# Patient Record
Sex: Female | Born: 1975 | Race: White | Hispanic: Yes | Marital: Married | Smoking: Former smoker
Health system: Southern US, Community
[De-identification: ages and names within clinical notes are randomized; demographics above are authoritative.]

## PROBLEM LIST (undated history)

## (undated) DIAGNOSIS — F419 Anxiety disorder, unspecified: Secondary | ICD-10-CM

## (undated) DIAGNOSIS — F32A Depression, unspecified: Secondary | ICD-10-CM

## (undated) DIAGNOSIS — F329 Major depressive disorder, single episode, unspecified: Secondary | ICD-10-CM

## (undated) DIAGNOSIS — L309 Dermatitis, unspecified: Secondary | ICD-10-CM

## (undated) DIAGNOSIS — E063 Autoimmune thyroiditis: Secondary | ICD-10-CM

## (undated) DIAGNOSIS — J45909 Unspecified asthma, uncomplicated: Secondary | ICD-10-CM

## (undated) DIAGNOSIS — G43909 Migraine, unspecified, not intractable, without status migrainosus: Secondary | ICD-10-CM

## (undated) DIAGNOSIS — F429 Obsessive-compulsive disorder, unspecified: Secondary | ICD-10-CM

## (undated) DIAGNOSIS — D649 Anemia, unspecified: Secondary | ICD-10-CM

## (undated) HISTORY — DX: Major depressive disorder, single episode, unspecified: F32.9

## (undated) HISTORY — DX: Depression, unspecified: F32.A

## (undated) HISTORY — DX: Anxiety disorder, unspecified: F41.9

## (undated) HISTORY — DX: Unspecified asthma, uncomplicated: J45.909

## (undated) HISTORY — DX: Dermatitis, unspecified: L30.9

## (undated) HISTORY — DX: Obsessive-compulsive disorder, unspecified: F42.9

## (undated) HISTORY — DX: Anemia, unspecified: D64.9

## (undated) HISTORY — PX: RHINOPLASTY: SUR1284

## (undated) HISTORY — DX: Migraine, unspecified, not intractable, without status migrainosus: G43.909

## (undated) HISTORY — PX: SEPTOPLASTY: SUR1290

---

## 2009-09-15 ENCOUNTER — Emergency Department (HOSPITAL_COMMUNITY): Admission: EM | Admit: 2009-09-15 | Discharge: 2009-09-15 | Payer: Self-pay | Admitting: Emergency Medicine

## 2010-01-17 ENCOUNTER — Other Ambulatory Visit: Admission: RE | Admit: 2010-01-17 | Discharge: 2010-01-17 | Payer: Self-pay | Admitting: *Deleted

## 2010-06-23 LAB — COMPREHENSIVE METABOLIC PANEL
ALT: 12 U/L (ref 0–35)
Albumin: 3.5 g/dL (ref 3.5–5.2)
Alkaline Phosphatase: 75 U/L (ref 39–117)
BUN: 6 mg/dL (ref 6–23)
CO2: 24 mEq/L (ref 19–32)
Calcium: 8.7 mg/dL (ref 8.4–10.5)
Creatinine, Ser: 0.69 mg/dL (ref 0.4–1.2)
GFR calc Af Amer: >60 mL/min (ref >60)
GFR calc non Af Amer: >60 mL/min (ref >60)
Glucose, Bld: 111 mg/dL — ABNORMAL HIGH (ref 70–99)
Potassium: 3.9 mEq/L (ref 3.5–5.1)
Total Protein: 6.4 g/dL (ref 6.0–8.3)

## 2010-06-23 LAB — URINALYSIS, ROUTINE W REFLEX MICROSCOPIC
Glucose, UA: NEGATIVE mg/dL
Nitrite: NEGATIVE
Urobilinogen, UA: 0.2 mg/dL (ref 0.0–1.0)
pH: 8.5 — ABNORMAL HIGH (ref 5.0–8.0)

## 2010-06-23 LAB — DIFFERENTIAL
Basophils Relative: 1 % (ref 0–1)
Eosinophils Absolute: 0.4 10*3/uL (ref 0.0–0.7)
Eosinophils Relative: 5 % (ref 0–5)
Lymphocytes Relative: 17 % (ref 12–46)
Lymphs Abs: 1.5 10*3/uL (ref 0.7–4.0)
Monocytes Absolute: 0.5 10*3/uL (ref 0.1–1.0)
Monocytes Relative: 6 % (ref 3–12)
Neutro Abs: 6.1 10*3/uL (ref 1.7–7.7)

## 2010-06-23 LAB — CBC
Hemoglobin: 11.9 g/dL — ABNORMAL LOW (ref 12.0–15.0)
MCV: 90.8 fL (ref 78.0–100.0)
RDW: 13.3 % (ref 11.5–15.5)

## 2010-08-20 ENCOUNTER — Other Ambulatory Visit: Payer: Self-pay | Admitting: Family Medicine

## 2010-08-20 DIAGNOSIS — R1031 Right lower quadrant pain: Secondary | ICD-10-CM

## 2010-08-21 ENCOUNTER — Ambulatory Visit
Admission: RE | Admit: 2010-08-21 | Discharge: 2010-08-21 | Disposition: A | Discharge status: Home / Self Care | Payer: 59 | Source: Ambulatory Visit | Attending: Family Medicine | Admitting: Family Medicine

## 2010-08-21 DIAGNOSIS — R1031 Right lower quadrant pain: Secondary | ICD-10-CM

## 2010-08-21 MED ORDER — IOHEXOL 300 MG/ML  SOLN
100.0000 mL | Freq: Once | INTRAMUSCULAR | Status: AC | PRN
  Administered 2010-08-21: 100 mL via INTRAVENOUS

## 2011-04-20 ENCOUNTER — Other Ambulatory Visit (HOSPITAL_COMMUNITY)
Admission: RE | Admit: 2011-04-20 | Discharge: 2011-04-20 | Disposition: A | Discharge status: Home / Self Care | Payer: 59 | Source: Ambulatory Visit | Attending: Internal Medicine | Admitting: Internal Medicine

## 2011-04-20 DIAGNOSIS — Z01419 Encounter for gynecological examination (general) (routine) without abnormal findings: Secondary | ICD-10-CM | POA: Insufficient documentation

## 2011-04-20 DIAGNOSIS — Z113 Encounter for screening for infections with a predominantly sexual mode of transmission: Secondary | ICD-10-CM | POA: Insufficient documentation

## 2011-06-11 IMAGING — CT CT ABD-PELV W/ CM
2 of 4 series · 17 of 46 positions shown, 19 images · IV contrast (READICAT/WATER & [ID] OMNI 300)
Comparison: Abdominal ultrasound 09/15/2009.

CLINICAL DATA: Right lower quadrant abdominal pain for a few weeks
with nausea and constipation.

CT ABDOMEN AND PELVIS WITH CONTRAST
TECHNIQUE: Multidetector CT imaging of the abdomen and pelvis was
performed following the standard protocol during bolus
administration of intravenous contrast.
Contrast: 100 ml Amnipaque-ZVV intravenously.

[Series 2: abdomen w/ · axial · 0.70mm/px · z∈[-376,-6]mm · 14 of 82 slices shown, 16 images]
[im 4/82  soft-tissue]
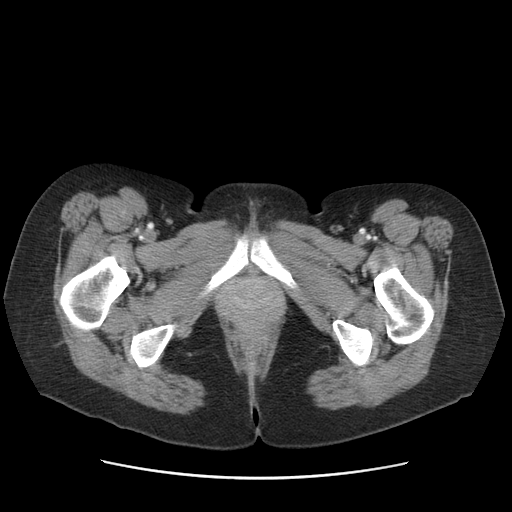
[im 4/82  bone]
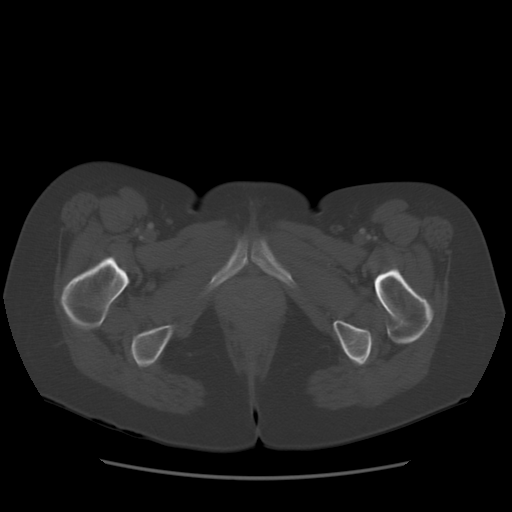
[im 10/82  soft-tissue]
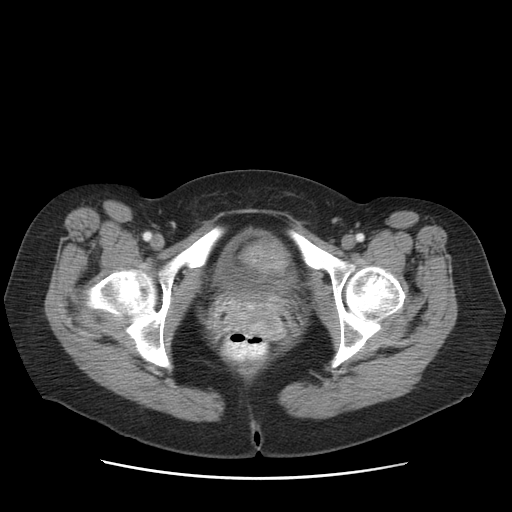
[im 17/82  soft-tissue]
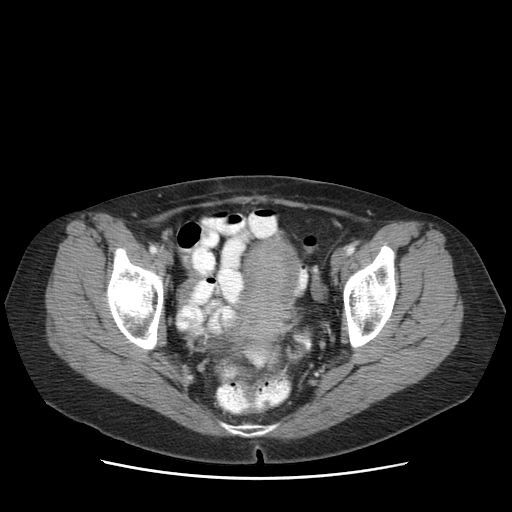
[im 23/82  soft-tissue]
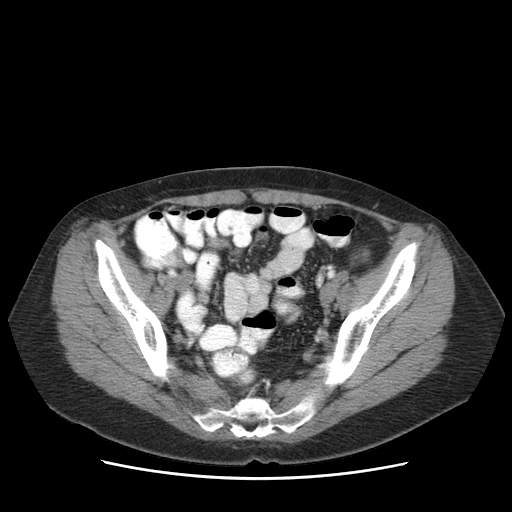
[im 26/82  soft-tissue]
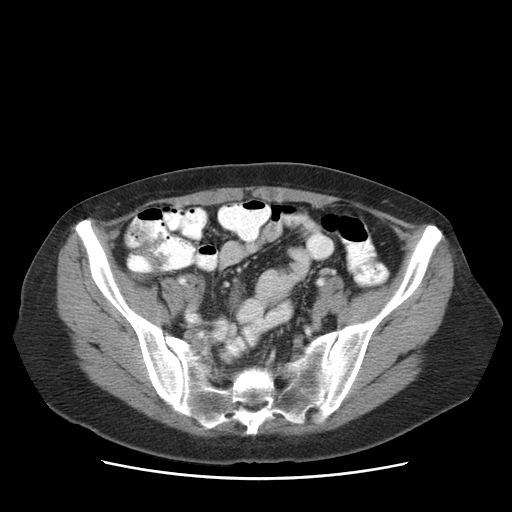
[im 33/82  soft-tissue]
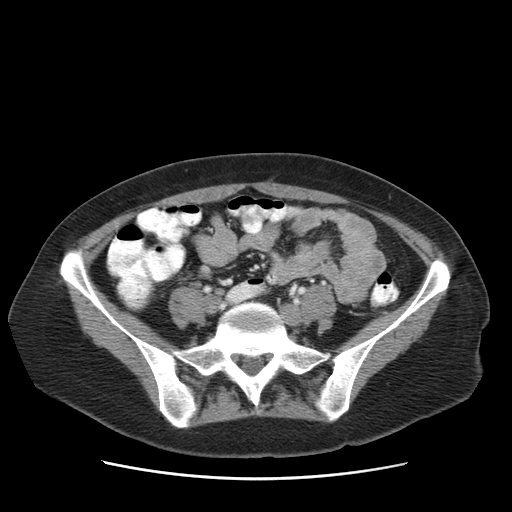
[im 39/82  soft-tissue]
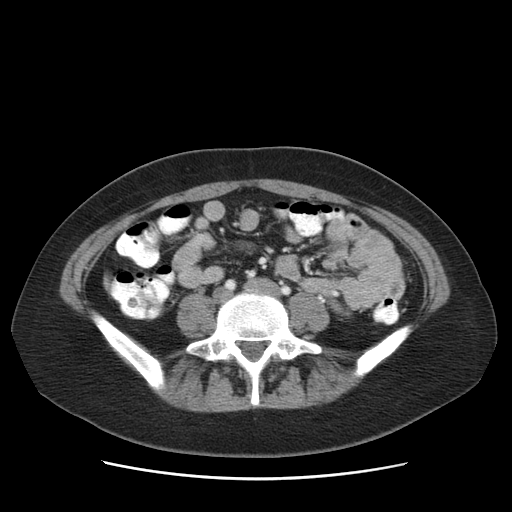
[im 43/82  soft-tissue]
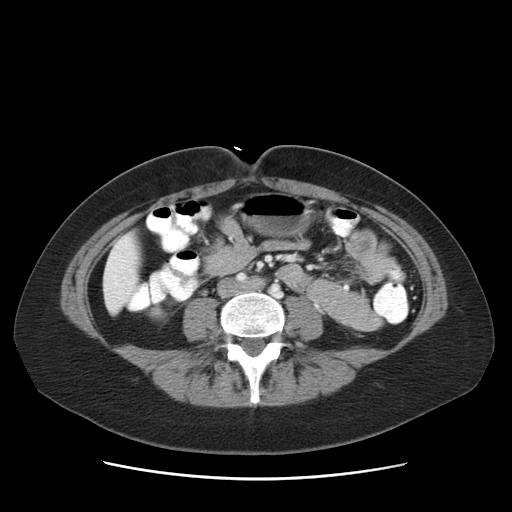
[im 49/82  soft-tissue]
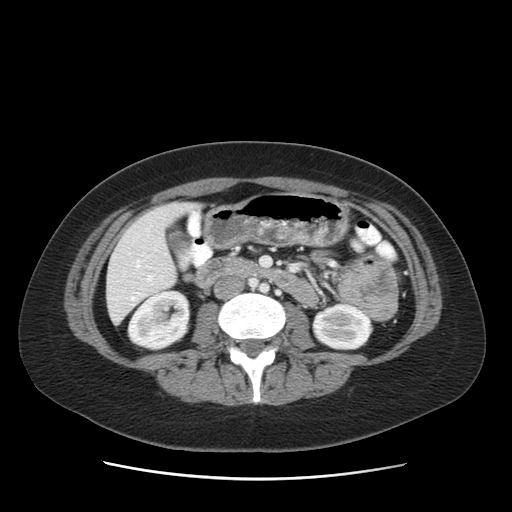
[im 49/82  bone]
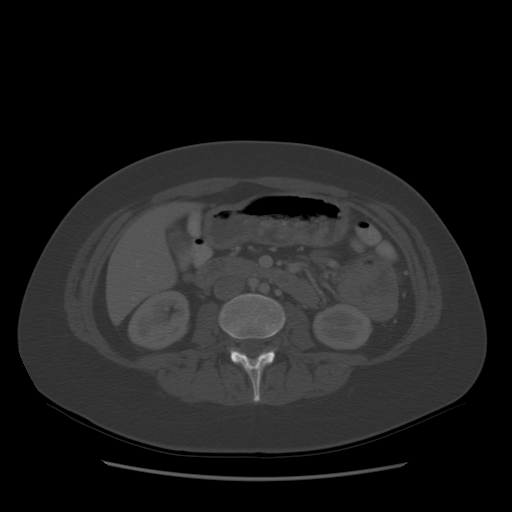
[im 56/82  soft-tissue]
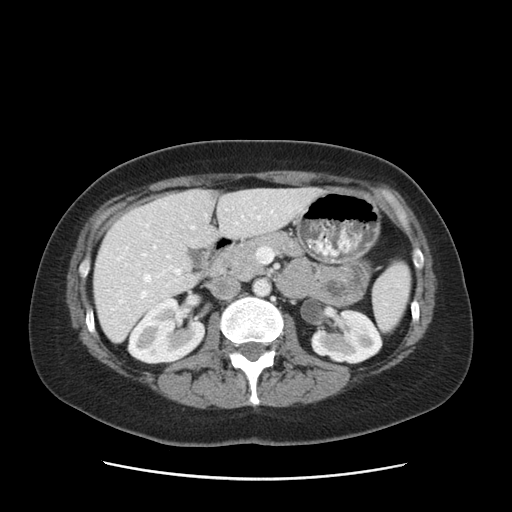
[im 62/82  soft-tissue]
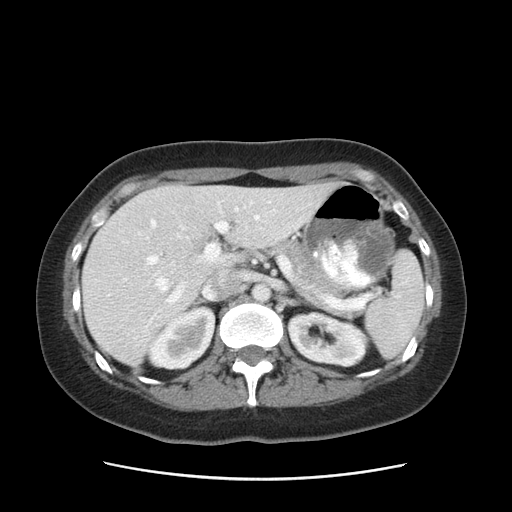
[im 65/82  soft-tissue]
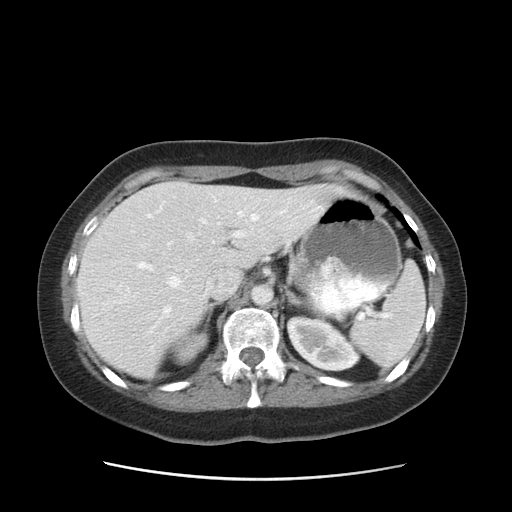
[im 72/82  soft-tissue]
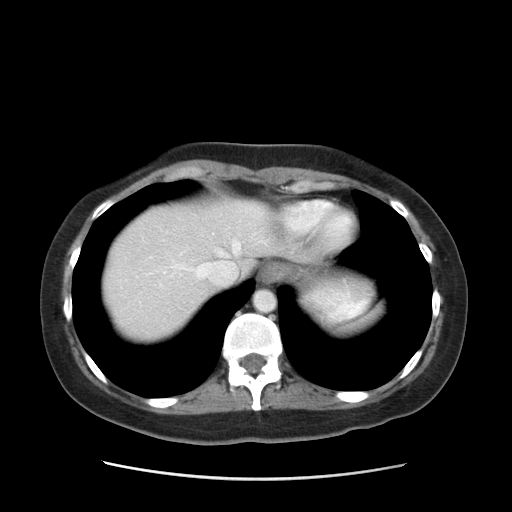
[im 78/82  soft-tissue]
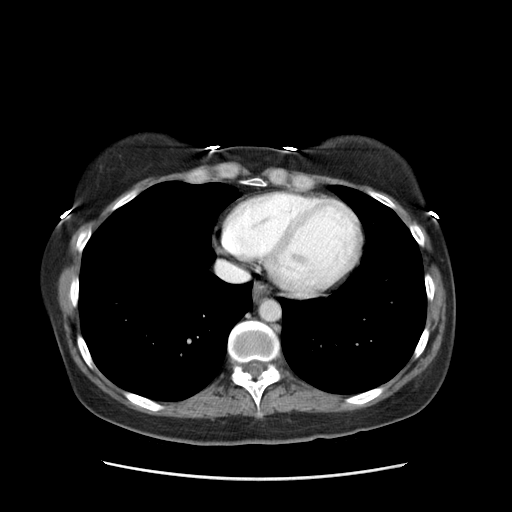

[Series 400: coronal · coronal · 0.86mm/px · 3 of 98 slices shown]
[im 33/98  soft-tissue]
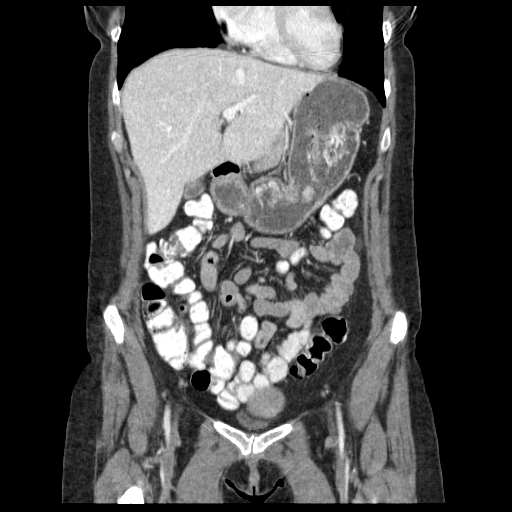
[im 44/98  soft-tissue]
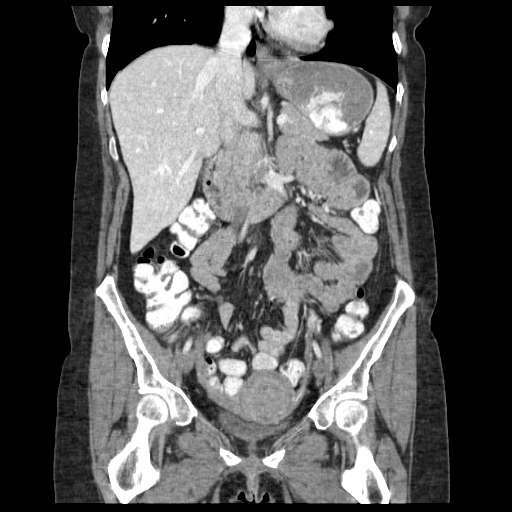
[im 54/98  soft-tissue]
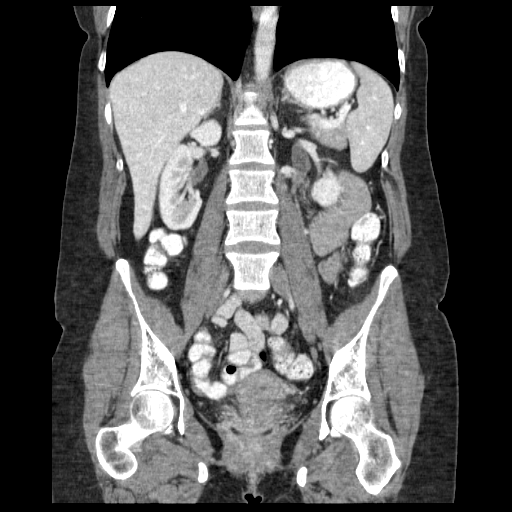

[17 of 46 positions shown; findings below may reference images not displayed]

FINDINGS: The lung bases are clear.  There is no pleural effusion.

The liver, spleen, gallbladder, pancreas, adrenal glands and
kidneys appear normal.

The bowel gas pattern is normal.  The transverse colon is
redundant.  The cecum and terminal ileum appear normal.  The
appendix is not well seen, but appears to project posteriorly along
the iliac vessels and is normal in caliber without surrounding
inflammation.  The uterus and ovaries appear normal.  There is a
contraceptive device within the vagina.  No osseous or vascular
abnormalities are identified.
IMPRESSION: Normal examination.

## 2012-11-03 ENCOUNTER — Other Ambulatory Visit (HOSPITAL_COMMUNITY)
Admission: RE | Admit: 2012-11-03 | Discharge: 2012-11-03 | Disposition: A | Discharge status: Home / Self Care | Payer: 59 | Source: Ambulatory Visit | Attending: Family Medicine | Admitting: Family Medicine

## 2012-11-03 ENCOUNTER — Other Ambulatory Visit: Payer: Self-pay | Admitting: Family Medicine

## 2012-11-03 DIAGNOSIS — Z0000 Encounter for general adult medical examination without abnormal findings: Secondary | ICD-10-CM | POA: Insufficient documentation

## 2012-12-08 ENCOUNTER — Other Ambulatory Visit: Payer: Self-pay | Admitting: Family Medicine

## 2012-12-09 ENCOUNTER — Other Ambulatory Visit: Payer: Self-pay | Admitting: Family Medicine

## 2012-12-09 DIAGNOSIS — N6320 Unspecified lump in the left breast, unspecified quadrant: Secondary | ICD-10-CM

## 2012-12-22 ENCOUNTER — Other Ambulatory Visit: Payer: Self-pay

## 2012-12-22 MED ORDER — OXYCODONE-ACETAMINOPHEN 7.5-325 MG PO TABS: 1.0000 | ORAL_TABLET | Freq: Three times a day (TID) | ORAL | Status: DC | PRN

## 2012-12-22 NOTE — Telephone Encounter (Signed)
Patient called requesting a refill on Percocet.  She would like the Rx mailed to her when it's ready.

## 2012-12-22 NOTE — Telephone Encounter (Signed)
Chart reviewed, see patient for chronic migraine, last office visit was February 2014, Percocet 30 tablets with no refill,  Please give her a followup appointment with Sheila Woods in 4-6 months

## 2012-12-23 NOTE — Telephone Encounter (Signed)
Rx signed and mailed.

## 2013-01-11 ENCOUNTER — Telehealth: Payer: Self-pay | Admitting: Neurology

## 2013-01-11 MED ORDER — TOPIRAMATE 50 MG PO TABS: ORAL_TABLET | ORAL | Status: DC

## 2013-01-11 NOTE — Telephone Encounter (Signed)
Rx sent 

## 2013-04-19 ENCOUNTER — Encounter: Payer: Self-pay | Admitting: Nurse Practitioner

## 2013-04-24 ENCOUNTER — Encounter: Payer: Self-pay | Admitting: Nurse Practitioner

## 2013-04-24 ENCOUNTER — Ambulatory Visit (INDEPENDENT_AMBULATORY_CARE_PROVIDER_SITE_OTHER): Payer: 59 | Admitting: Nurse Practitioner

## 2013-04-24 ENCOUNTER — Encounter (INDEPENDENT_AMBULATORY_CARE_PROVIDER_SITE_OTHER): Payer: Self-pay

## 2013-04-24 VITALS — BP 100/64 | HR 74 | Ht 66.0 in | Wt 130.0 lb

## 2013-04-24 DIAGNOSIS — F341 Dysthymic disorder: Secondary | ICD-10-CM | POA: Insufficient documentation

## 2013-04-24 DIAGNOSIS — G43909 Migraine, unspecified, not intractable, without status migrainosus: Secondary | ICD-10-CM

## 2013-04-24 MED ORDER — OXYCODONE-ACETAMINOPHEN 7.5-325 MG PO TABS: 1.0000 | ORAL_TABLET | Freq: Three times a day (TID) | ORAL | Status: DC | PRN

## 2013-04-24 MED ORDER — TOPIRAMATE 50 MG PO TABS: ORAL_TABLET | ORAL | Status: DC

## 2013-04-24 MED ORDER — SUMATRIPTAN SUCCINATE 6 MG/0.5ML ~~LOC~~ SOTJ: 6.0000 mg | SUBCUTANEOUS | Status: DC | PRN

## 2013-04-24 NOTE — Patient Instructions (Signed)
Given co pay card for Sumavel Refill percocet Continue Topamax at current dose will refill F/U yearly and prn

## 2013-04-24 NOTE — Progress Notes (Signed)
[facility]  PATIENT: Sheila Woods DOB: 07-14-1975   REASON FOR VISIT: Followup for migraine   HISTORY OF PRESENT ILLNESS:Ms Sheila Woods, 38 year old female returns for followup. She has a history of migraines and is currently on Topamax 150 daily. She has Sumavel  ordered acutely, but due to cost has been taking Percocet. She was recently diagnosed as hypothyroid. She continues to have adjustments in her medication. Headaches are in fairly good control. She returns for reevaluation    She has PMHx of OCD, iron deficiency anemia, depression presenting with chronic migraines.She had headaches since age 89, migraine described as severe stabbing pain, lateralized, radiating to her jaw, often proceed by visual changes, loss of peripheral vision.  sometimes, oculomigraine alone without significant headahces. Headache lasted 2 hours, with preventive Topamax, she only has 1-2 every month,  She is taking percocet 7.5/325 prn and sumavel prn. Tired imitrex injection, and other triptan in the past including imitrex po, maxalt, relpax, zomig, triximex,without help.  She has tried other preventive medications in the past including pamelor, effexor, elavil, nortriptyline, depakote without help either.   07/16/11: Pt reports migraines are better, she has been taking an over the counter preparation caled Limu. She claims she is taking her Topamax as well even though [name] received a letter from [employer] stating her compliance with meds was less than 70%.She did not do Botox because of her co pay. She has seen ENT Dr. [name] who wanted her to have CT to see if polyps had returned but she did not want to go through another surgery. She has been placed on abilify by psych. She recently got married. No new neurologic complaints.   04/22/12: Returns for followup. Has been off her Topamax for several months because she ran out. Has had a daily headache since that time. She has stopped her  Propranolol. She continues to see psych for her anxiety, depression. She works out of the home.     REVIEW OF SYSTEMS: Full 14 system review of systems performed and notable only for those listed, all others are neg:  Constitutional: Fatigue Cardiovascular: Palpitations  Ear/Nose/Throat: N/A  Skin: N/A  Eyes: N/A  Respiratory: N/A  Gastroitestinal: N/A  Hematology/Lymphatic: N/A  Endocrine: Intolerance to cold  Musculoskeletal: Joint pain aching muscles Allergy/Immunology: N/A  Neurological: Headache Psychiatric: Depression anxiety   ALLERGIES: No Known Allergies  HOME MEDICATIONS: Outpatient Prescriptions Prior to Visit  Medication Sig Dispense Refill  . cetirizine (ZYRTEC) 10 MG chewable tablet Chew 10 mg by mouth daily.      . clonazePAM (KLONOPIN) 2 MG tablet Take 2 mg by mouth 2 (two) times daily.      Marland Kitchen. etonogestrel-ethinyl estradiol (NUVARING) 0.12-0.015 MG/24HR vaginal ring Place 1 each vaginally every 28 (twenty-eight) days. Insert vaginally and leave in place for 3 consecutive weeks, then remove for 1 week.      Marland Kitchen. FLUoxetine (PROZAC) 40 MG capsule Take 40 mg by mouth 2 (two) times daily.       Marland Kitchen. oxyCODONE-acetaminophen (PERCOCET) 7.5-325 MG per tablet Take 1 tablet by mouth every 8 (eight) hours as needed.  30 tablet  0  . SUMAtriptan Succinate (SUMAVEL DOSEPRO) 6 MG/0.5ML SOTJ Inject into the skin.      Marland Kitchen. topiramate (TOPAMAX) 50 MG tablet One in the morning and two at night  90 tablet  3  . BUPROPION HCL PO Take by mouth.       No facility-administered medications prior to visit.  PAST MEDICAL HISTORY: Past Medical History  Diagnosis Date  . Anemia   . Asthma   . OCD (obsessive compulsive disorder)   . Eczema   . Migraine   . Anxiety   . Depression     PAST SURGICAL HISTORY: Past Surgical History  Procedure Laterality Date  . Rhinoplasty    . Septoplasty    . Cesarean section      FAMILY HISTORY: History reviewed. No pertinent family  history.  SOCIAL HISTORY: History   Social History  . Marital Status: Married    Spouse Name: Sheila Woods    Number of Children: 1  . Years of Education: college   Occupational History  .  [employer]   Social History Main Topics  . Smoking status: Former Games developer  . Smokeless tobacco: Never Used  . Alcohol Use: Yes     Comment: occas.  . Drug Use: No     Comment: quit over 20 years ago  . Sexual Activity: Not on file   Other Topics Concern  . Not on file   Social History Narrative   Patient is married Sheila Woods) and has one child.   Patient works from home for [employer].   Patient has a college education.   Patient drinks one cup of caffeine daily.           PHYSICAL EXAM  Filed Vitals:   04/24/13 1453  BP: 100/64  Pulse: 74  Height: 5\' 6"  (1.676 m)  Weight: 130 lb (58.968 kg)   Body mass index is 20.99 kg/(m^2).  Generalized: Well developed, in no acute distress   Neurological examination   Mentation: Alert oriented to time, place, history taking. Follows all commands speech and language fluent  Cranial nerve II-XII: Pupils were equal round reactive to light extraocular movements were full, visual field were full on confrontational test. Facial sensation and strength were normal. hearing was intact to finger rubbing bilaterally. Uvula tongue midline. head turning and shoulder shrug were normal and symmetric.Tongue protrusion into cheek strength was normal. Motor: normal bulk and tone, full strength in the BUE, BLE, fine finger movements normal, no pronator drift. No focal weakness Coordination: finger-nose-finger, heel-to-shin bilaterally, no dysmetria Reflexes: Brachioradialis 2/2, biceps 2/2, triceps 2/2, patellar 2/2, Achilles 2/2, plantar responses were flexor bilaterally. Gait and Station: Rising up from seated position without assistance, normal stance,  moderate stride, good arm swing, smooth turning, able to perform tiptoe, and heel walking without  difficulty. Tandem gait is steady  DIAGNOSTIC DATA (LABS, IMAGING, TESTING) -None to review     ASSESSMENT AND PLAN  38 y.o. year old female  has a past medical history of Anemia; Asthma; OCD (obsessive compulsive disorder);  Migraine; Anxiety; and Depression. here to followup.  Given co pay card for Sumavel this is to be used acutely before Percocet Refill percocet Continue Topamax at current dose will refill F/U yearly and prn  Nilda Riggs, [facility], [facility], APRN  [facility] [address], [address] [place], [place] [address] [phone]

## 2013-07-10 ENCOUNTER — Other Ambulatory Visit: Payer: 59

## 2013-07-13 ENCOUNTER — Other Ambulatory Visit: Payer: 59

## 2013-08-31 ENCOUNTER — Other Ambulatory Visit: Payer: 59

## 2013-09-08 ENCOUNTER — Other Ambulatory Visit: Payer: 59

## 2013-11-28 ENCOUNTER — Other Ambulatory Visit: Payer: Self-pay | Admitting: Neurology

## 2013-11-28 NOTE — Telephone Encounter (Signed)
Spoke to patient and she relayed that she needs a new prescription for Percocet. She mentioned that she normally has it mailed.  This is a Dr. Mare Ferrari patient.

## 2013-11-28 NOTE — Telephone Encounter (Signed)
Dr. Terrace Arabia is back tomorrow; will forward to her to see if she wants patient on long term narcotics for migraine. -VRP

## 2013-11-28 NOTE — Telephone Encounter (Signed)
Dr Terrace Arabia is out of the office.  Rx entered, and request forwarded to [facility] for approval.

## 2013-11-29 MED ORDER — OXYCODONE-ACETAMINOPHEN 7.5-325 MG PO TABS: 1.0000 | ORAL_TABLET | Freq: Three times a day (TID) | ORAL | Status: DC | PRN

## 2013-11-29 NOTE — Telephone Encounter (Signed)
Chart reviewed, I refilled her percocet prn

## 2013-12-05 NOTE — Telephone Encounter (Signed)
Called pt to inform her that her Rx was being mailed out today and if she has any other problems, questions or concerns to call the office. Pt verbalized understanding. °

## 2013-12-28 ENCOUNTER — Other Ambulatory Visit (HOSPITAL_COMMUNITY)
Admission: RE | Admit: 2013-12-28 | Discharge: 2013-12-28 | Disposition: A | Discharge status: Home / Self Care | Payer: 59 | Source: Ambulatory Visit | Attending: Family Medicine | Admitting: Family Medicine

## 2013-12-28 ENCOUNTER — Other Ambulatory Visit: Payer: Self-pay | Admitting: Family Medicine

## 2013-12-28 DIAGNOSIS — Z0000 Encounter for general adult medical examination without abnormal findings: Secondary | ICD-10-CM | POA: Insufficient documentation

## 2014-01-01 LAB — CYTOLOGY - PAP

## 2014-02-27 ENCOUNTER — Other Ambulatory Visit: Payer: Self-pay | Admitting: Nurse Practitioner

## 2014-02-27 MED ORDER — OXYCODONE-ACETAMINOPHEN 7.5-325 MG PO TABS: 1.0000 | ORAL_TABLET | Freq: Three times a day (TID) | ORAL | Status: DC | PRN

## 2014-02-27 NOTE — Telephone Encounter (Signed)
Request entered, forwarded to provider for approval.  

## 2014-02-27 NOTE — Telephone Encounter (Signed)
Patient requesting Rx refill for oxyCODONE-acetaminophen (PERCOCET) 7.5-325 MG per tablet.  Please call when ready for pick up. °

## 2014-02-28 NOTE — Telephone Encounter (Signed)
I called the patient to let them know their Rx for Oxycodone (Percocet) was ready for pickup. Patient was instructed to bring Photo ID. 

## 2014-04-23 ENCOUNTER — Other Ambulatory Visit: Payer: Self-pay

## 2014-04-23 MED ORDER — OXYCODONE-ACETAMINOPHEN 7.5-325 MG PO TABS: 1.0000 | ORAL_TABLET | Freq: Three times a day (TID) | ORAL | Status: DC | PRN

## 2014-04-23 NOTE — Telephone Encounter (Signed)
Patient requesting refill on Percocet 7.5-500mg  1 tab q8h prn

## 2014-04-23 NOTE — Telephone Encounter (Signed)
Request entered, forwarded to provider for approval.   Patient has appt scheduled in March

## 2014-04-24 ENCOUNTER — Telehealth: Payer: Self-pay

## 2014-04-24 ENCOUNTER — Ambulatory Visit: Payer: 59 | Admitting: Nurse Practitioner

## 2014-04-24 NOTE — Telephone Encounter (Signed)
Called patient and informed Rx ready for pick up at front desk. Patient verbalized understanding.  

## 2014-05-09 ENCOUNTER — Other Ambulatory Visit: Payer: Self-pay | Admitting: Family Medicine

## 2014-05-09 DIAGNOSIS — R1031 Right lower quadrant pain: Secondary | ICD-10-CM

## 2014-05-14 ENCOUNTER — Other Ambulatory Visit: Payer: Self-pay | Admitting: Nurse Practitioner

## 2014-05-22 ENCOUNTER — Telehealth: Payer: Self-pay

## 2014-05-22 NOTE — Telephone Encounter (Signed)
See previous phone call about schedule change.

## 2014-05-22 NOTE — Telephone Encounter (Signed)
Called and left patient a message about rescheduling her apt  With Bentonarolyn on 06-26-14. At 3:00. CM/Yan. Please when patient call's reschedule her apt. Thanks Annabelle Harmanana.

## 2014-06-06 ENCOUNTER — Telehealth: Payer: Self-pay | Admitting: Nurse Practitioner

## 2014-06-06 NOTE — Telephone Encounter (Signed)
Pt is calling requesting a written Rx for oxyCODONE-acetaminophen (PERCOCET) 7.5-325 MG per tablet. Please call when ready for pick up.

## 2014-06-07 NOTE — Telephone Encounter (Signed)
Michelle: Please call patients, last visit was with Darrol Angelarolyn Martin January 2015, follow-up visit is scheduled for April 2016, we will discuss her medication treatment then  I just gave her a new prescription of Percocet 30 tablets in January 2016, will not give her another one.

## 2014-06-07 NOTE — Telephone Encounter (Signed)
Spoke to patient - moved appt to an earlier date.

## 2014-06-26 ENCOUNTER — Ambulatory Visit (INDEPENDENT_AMBULATORY_CARE_PROVIDER_SITE_OTHER): Payer: 59 | Admitting: Neurology

## 2014-06-26 ENCOUNTER — Encounter: Payer: Self-pay | Admitting: Neurology

## 2014-06-26 ENCOUNTER — Ambulatory Visit: Payer: Self-pay | Admitting: Nurse Practitioner

## 2014-06-26 VITALS — BP 119/83 | HR 91 | Ht 66.0 in | Wt 140.0 lb

## 2014-06-26 DIAGNOSIS — G43009 Migraine without aura, not intractable, without status migrainosus: Secondary | ICD-10-CM | POA: Diagnosis not present

## 2014-06-26 MED ORDER — NORTRIPTYLINE HCL 10 MG PO CAPS: 20.0000 mg | ORAL_CAPSULE | Freq: Every day | ORAL | Status: DC

## 2014-06-26 MED ORDER — OXYCODONE-ACETAMINOPHEN 7.5-325 MG PO TABS: 1.0000 | ORAL_TABLET | Freq: Three times a day (TID) | ORAL | Status: DC | PRN

## 2014-06-26 MED ORDER — TOPIRAMATE 100 MG PO TABS: 100.0000 mg | ORAL_TABLET | Freq: Two times a day (BID) | ORAL | Status: DC

## 2014-06-26 MED ORDER — DICLOFENAC POTASSIUM(MIGRAINE) 50 MG PO PACK: 50.0000 mg | PACK | ORAL | Status: DC | PRN

## 2014-06-26 NOTE — Progress Notes (Signed)
[facility]  PATIENT: Sheila Woods DOB: 11/08/1975   REASON FOR VISIT: Followup for migraine   HISTORY OF PRESENT ILLNESS:Sheila Woods, 39 year-old female returns for followup.   She has PMHx of OCD, iron deficiency anemia, depression presenting with chronic migraines.She had headaches since age 35, migraine described as severe stabbing pain, lateralized, radiating to her jaw, often proceed by visual changes, loss of peripheral vision.  sometimes, oculomigraine alone without significant headahces. Headache lasted 2 hours, with preventive Topamax, she only has 1-2 every month,  She is taking percocet 7.5/325 prn and sumavel prn. Tired imitrex injection, and other triptan in the past including imitrex po, maxalt, relpax, zomig, triximex,without help.  She has tried other preventive medications in the past including pamelor, effexor, elavil, nortriptyline, depakote without help either.  07/16/11: Pt reports migraines are better, she has been taking an over the counter preparation caled Limu. She claims she is taking her Topamax as well even though Dr. Terrace Arabia received a letter from united [employer] stating her compliance with meds was less than 70%.She did not do Botox because of her co pay. She has seen ENT Dr. Jearld Fenton who wanted her to have CT to see if polyps had returned but she did not want to go through another surgery. She has been placed on abilify by psych. She recently got married. No new neurologic complaints.   04/22/12: Returns for followup. Has been off her Topamax for several months because she ran out. Has had a daily headache since that time. She has stopped her Propranolol. She continues to see psych for her anxiety, depression. She works out of the home.     04/2013: She has a history of migraines and is currently on Topamax 150 daily. She has Sumavel  ordered acutely, but due to cost has been taking Percocet. She was recently diagnosed as hypothyroid. She continues to  have adjustments in her medication. Headaches are in fairly good control. She returns for reevaluation  UPDATE March 22nd 2016: She still has headache 3 times a week, retrorbital area, occipital region, pounding, light noise sensitive, nauseous,  She has to take off from work, up to 2 days a week, she is on Topamax 150 mg daily, denies significant side effect, she used about 30 tablets of Percocet in one month, previously tried different triptan's, including Imitrex, Maxalt, Zomig, Relpax, without helping, currently taking Imitrex injection as needed, not sure if this truly helping her, Percocet helps her temporarily, she is continue combating with significant depression, is taking Latuda and Clonazepam   REVIEW OF SYSTEMS: Full 14 system review of systems performed and notable only for those listed, all others are neg: Blurred vision, cold intolerance, memory loss, dizziness, headaches, speech difficulty, depression, anxiety, insomnia, daytime sleepiness, snoring    ALLERGIES: No Known Allergies  HOME MEDICATIONS: Outpatient Prescriptions Prior to Visit  Medication Sig Dispense Refill  . cetirizine (ZYRTEC) 10 MG chewable tablet Chew 10 mg by mouth daily.    . clonazePAM (KLONOPIN) 2 MG tablet Take 2 mg by mouth 2 (two) times daily.    Marland Kitchen levothyroxine (SYNTHROID, LEVOTHROID) 50 MCG tablet Take 50 mcg by mouth daily.    Marland Kitchen oxyCODONE-acetaminophen (PERCOCET) 7.5-325 MG per tablet Take 1 tablet by mouth every 8 (eight) hours as needed. 30 tablet 0  . SUMAtriptan Succinate (SUMAVEL DOSEPRO) 6 MG/0.5ML SOTJ Inject 0.5 mLs (6 mg total) into the skin as needed. 4 Syringe 6  . topiramate (TOPAMAX) 50 MG tablet TAKE 1 TABLET  BY MOUTH INTHE MORNING AND 2 TABLETSAT NIGHT 90 tablet 1  . etonogestrel-ethinyl estradiol (NUVARING) 0.12-0.015 MG/24HR vaginal ring Place 1 each vaginally every 28 (twenty-eight) days. Insert vaginally and leave in place for 3 consecutive weeks, then remove for 1 week.    Marland Kitchen  FLUoxetine (PROZAC) 40 MG capsule Take 40 mg by mouth 2 (two) times daily.      No facility-administered medications prior to visit.    PAST MEDICAL HISTORY: Past Medical History  Diagnosis Date  . Anemia   . Asthma   . OCD (obsessive compulsive disorder)   . Eczema   . Migraine   . Anxiety   . Depression     PAST SURGICAL HISTORY: Past Surgical History  Procedure Laterality Date  . Rhinoplasty    . Septoplasty    . Cesarean section      FAMILY HISTORY: No family history on file.  SOCIAL HISTORY: History   Social History  . Marital Status: Married    Spouse Name: [name]  . Number of Children: 1  . Years of Education: college   Occupational History  .  [employer]   Social History Main Topics  . Smoking status: Former Games developer  . Smokeless tobacco: Never Used  . Alcohol Use: Yes     Comment: occas.  . Drug Use: No     Comment: quit over 20 years ago  . Sexual Activity: Not on file   Other Topics Concern  . Not on file   Social History Narrative   Patient is married [name]) and has one child.   Patient works from home for [employer].   Patient has a college education.   Patient drinks one cup of caffeine daily.           PHYSICAL EXAM  Filed Vitals:   06/26/14 1434  BP: 119/83  Pulse: 91  Height:  (1.676 m)  Weight: 140 lb (63.504 kg)   Body mass index is 22.61 kg/(m^2).   PHYSICAL EXAMNIATION:  Gen: NAD, conversant, well nourised, obese, well groomed                     Cardiovascular: Regular rate rhythm, no peripheral edema, warm, nontender. Eyes: Conjunctivae clear without exudates or hemorrhage Neck: Supple, no carotid bruise. Pulmonary: Clear to auscultation bilaterally   NEUROLOGICAL EXAM:  MENTAL STATUS: Speech:    Speech is normal; fluent and spontaneous with normal comprehension.  Cognition:    The patient is oriented to person, place, and time;     recent and remote memory intact;     language fluent;      normal attention, concentration,     fund of knowledge.  CRANIAL NERVES: CN II: Visual fields are full to confrontation. Fundoscopic exam is normal with sharp discs and no vascular changes. Venous pulsations are present bilaterally. Pupils are 4 mm and briskly reactive to light. Visual acuity is 20/20 bilaterally. CN III, IV, VI: extraocular movement are normal. No ptosis. CN V: Facial sensation is intact to pinprick in all 3 divisions bilaterally. Corneal responses are intact.  CN VII: Face is symmetric with normal eye closure and smile. CN VIII: Hearing is normal to rubbing fingers CN IX, X: Palate elevates symmetrically. Phonation is normal. CN XI: Head turning and shoulder shrug are intact CN XII: Tongue is midline with normal movements and no atrophy.  MOTOR: There is no pronator drift of out-stretched arms. Muscle bulk and tone are normal. Muscle strength  is normal.   Shoulder abduction Shoulder external rotation Elbow flexion Elbow extension Wrist flexion Wrist extension Finger abduction Hip flexion Knee flexion Knee extension Ankle dorsi flexion Ankle plantar flexion  R 5 5 5 5 5 5 5 5 5 5 5 5   L 5 5 5 5 5 5 5 5 5 5 5 5     REFLEXES: Reflexes are 2+ and symmetric at the biceps, triceps, knees, and ankles. Plantar responses are flexor.  SENSORY: Light touch, pinprick, position sense, and vibration sense are intact in fingers and toes.  COORDINATION: Rapid alternating movements and fine finger movements are intact. There is no dysmetria on finger-to-nose and heel-knee-shin. There are no abnormal or extraneous movements.   GAIT/STANCE: Posture is normal. Gait is steady with normal steps, base, arm swing, and turning. Heel and toe walking are normal. Tandem gait is normal.  Romberg is absent.   DIAGNOSTIC DATA (LABS, IMAGING, TESTING) -None to review  ASSESSMENT AND PLAN  39 y.o. year old female  has a past medical history of Anemia; Asthma; OCD (obsessive compulsive  disorder);  Migraine; Anxiety; and Depression. here to followup.  Imitrex injection as needed, cambia as needed Refill percocet 30 tablets for 3 months  Topamax100mg  bid, add on Nortriptyline 10mg  2 tabs po qhs.  Levert FeinsteinYijun Amauri Medellin, M.D. Ph.D.  [facility] [address] [place], [place] [address] Phone: [phone] Fax:      [phone]

## 2014-06-27 ENCOUNTER — Other Ambulatory Visit: Payer: Self-pay

## 2014-07-23 ENCOUNTER — Ambulatory Visit: Payer: Self-pay | Admitting: Nurse Practitioner

## 2014-09-05 ENCOUNTER — Emergency Department (HOSPITAL_COMMUNITY): Payer: 59

## 2014-09-05 ENCOUNTER — Emergency Department (HOSPITAL_COMMUNITY)
Admission: EM | Admit: 2014-09-05 | Discharge: 2014-09-05 | Disposition: A | Discharge status: Home / Self Care | Payer: 59 | Attending: Emergency Medicine | Admitting: Emergency Medicine

## 2014-09-05 ENCOUNTER — Encounter (HOSPITAL_COMMUNITY): Payer: Self-pay | Admitting: Emergency Medicine

## 2014-09-05 DIAGNOSIS — Z3202 Encounter for pregnancy test, result negative: Secondary | ICD-10-CM | POA: Insufficient documentation

## 2014-09-05 DIAGNOSIS — J45909 Unspecified asthma, uncomplicated: Secondary | ICD-10-CM | POA: Diagnosis not present

## 2014-09-05 DIAGNOSIS — N23 Unspecified renal colic: Secondary | ICD-10-CM | POA: Diagnosis not present

## 2014-09-05 DIAGNOSIS — F419 Anxiety disorder, unspecified: Secondary | ICD-10-CM | POA: Insufficient documentation

## 2014-09-05 DIAGNOSIS — Z872 Personal history of diseases of the skin and subcutaneous tissue: Secondary | ICD-10-CM | POA: Insufficient documentation

## 2014-09-05 DIAGNOSIS — E063 Autoimmune thyroiditis: Secondary | ICD-10-CM | POA: Insufficient documentation

## 2014-09-05 DIAGNOSIS — Z79899 Other long term (current) drug therapy: Secondary | ICD-10-CM | POA: Diagnosis not present

## 2014-09-05 DIAGNOSIS — F329 Major depressive disorder, single episode, unspecified: Secondary | ICD-10-CM | POA: Insufficient documentation

## 2014-09-05 DIAGNOSIS — D649 Anemia, unspecified: Secondary | ICD-10-CM | POA: Diagnosis not present

## 2014-09-05 DIAGNOSIS — G43909 Migraine, unspecified, not intractable, without status migrainosus: Secondary | ICD-10-CM | POA: Diagnosis not present

## 2014-09-05 DIAGNOSIS — Z720 Tobacco use: Secondary | ICD-10-CM | POA: Insufficient documentation

## 2014-09-05 DIAGNOSIS — F42 Obsessive-compulsive disorder: Secondary | ICD-10-CM | POA: Diagnosis not present

## 2014-09-05 DIAGNOSIS — R1032 Left lower quadrant pain: Secondary | ICD-10-CM | POA: Diagnosis present

## 2014-09-05 HISTORY — DX: Autoimmune thyroiditis: E06.3

## 2014-09-05 LAB — CBC WITH DIFFERENTIAL/PLATELET
Basophils Absolute: 0 10*3/uL (ref 0.0–0.1)
Basophils Relative: 0 % (ref 0–1)
Eosinophils Absolute: 0 10*3/uL (ref 0.0–0.7)
Eosinophils Relative: 0 % (ref 0–5)
HCT: 40.2 % (ref 36.0–46.0)
Hemoglobin: 12.8 g/dL (ref 12.0–15.0)
Lymphocytes Relative: 14 % (ref 12–46)
Lymphs Abs: 1.9 10*3/uL (ref 0.7–4.0)
MCH: 29.6 pg (ref 26.0–34.0)
MCHC: 31.8 g/dL (ref 30.0–36.0)
MCV: 92.8 fL (ref 78.0–100.0)
Monocytes Absolute: 0.7 10*3/uL (ref 0.1–1.0)
Monocytes Relative: 5 % (ref 3–12)
Neutro Abs: 10.8 10*3/uL — ABNORMAL HIGH (ref 1.7–7.7)
Neutrophils Relative %: 81 % — ABNORMAL HIGH (ref 43–77)
Platelets: 225 10*3/uL (ref 150–400)
RBC: 4.33 MIL/uL (ref 3.87–5.11)
RDW: 13.1 % (ref 11.5–15.5)
WBC: 13.5 10*3/uL — ABNORMAL HIGH (ref 4.0–10.5)

## 2014-09-05 LAB — COMPREHENSIVE METABOLIC PANEL
ALT: 10 U/L — ABNORMAL LOW (ref 14–54)
AST: 17 U/L (ref 15–41)
Albumin: 4.3 g/dL (ref 3.5–5.0)
Alkaline Phosphatase: 68 U/L (ref 38–126)
Anion gap: 7 (ref 5–15)
BUN: 14 mg/dL (ref 6–20)
CO2: 22 mmol/L (ref 22–32)
Calcium: 8.9 mg/dL (ref 8.9–10.3)
Chloride: 109 mmol/L (ref 101–111)
Creatinine, Ser: 1 mg/dL (ref 0.44–1.00)
GFR calc Af Amer: >60 mL/min (ref >60)
GFR calc non Af Amer: >60 mL/min (ref >60)
Glucose, Bld: 129 mg/dL — ABNORMAL HIGH (ref 65–99)
Potassium: 4 mmol/L (ref 3.5–5.1)
Sodium: 138 mmol/L (ref 135–145)
Total Bilirubin: 0.4 mg/dL (ref 0.3–1.2)
Total Protein: 7.2 g/dL (ref 6.5–8.1)

## 2014-09-05 LAB — POC URINE PREG, ED: Preg Test, Ur: NEGATIVE

## 2014-09-05 LAB — URINE MICROSCOPIC-ADD ON

## 2014-09-05 LAB — URINALYSIS, ROUTINE W REFLEX MICROSCOPIC
Bilirubin Urine: NEGATIVE
Glucose, UA: NEGATIVE mg/dL
Ketones, ur: >80 mg/dL — AB
Leukocytes, UA: NEGATIVE
Nitrite: NEGATIVE
Protein, ur: NEGATIVE mg/dL
Specific Gravity, Urine: 1.02 (ref 1.005–1.030)
Urobilinogen, UA: 0.2 mg/dL (ref 0.0–1.0)
pH: 5.5 (ref 5.0–8.0)

## 2014-09-05 LAB — LIPASE, BLOOD: Lipase: 24 U/L (ref 22–51)

## 2014-09-05 MED ORDER — HYDROMORPHONE HCL 1 MG/ML IJ SOLN
1.0000 mg | Freq: Once | INTRAMUSCULAR | Status: AC
  Administered 2014-09-05: 1 mg via INTRAVENOUS
  Filled 2014-09-05: qty 1

## 2014-09-05 MED ORDER — ONDANSETRON HCL 4 MG/2ML IJ SOLN
4.0000 mg | Freq: Once | INTRAMUSCULAR | Status: AC
  Administered 2014-09-05: 4 mg via INTRAVENOUS
  Filled 2014-09-05: qty 2

## 2014-09-05 MED ORDER — IOHEXOL 300 MG/ML  SOLN
100.0000 mL | Freq: Once | INTRAMUSCULAR | Status: AC | PRN
  Administered 2014-09-05: 100 mL via INTRAVENOUS

## 2014-09-05 MED ORDER — SODIUM CHLORIDE 0.9 % IV BOLUS (SEPSIS)
1000.0000 mL | Freq: Once | INTRAVENOUS | Status: AC
  Administered 2014-09-05: 1000 mL via INTRAVENOUS

## 2014-09-05 MED ORDER — KETOROLAC TROMETHAMINE 15 MG/ML IJ SOLN
15.0000 mg | Freq: Once | INTRAMUSCULAR | Status: AC
  Administered 2014-09-05: 15 mg via INTRAVENOUS
  Filled 2014-09-05: qty 1

## 2014-09-05 MED ORDER — IOHEXOL 300 MG/ML  SOLN
50.0000 mL | Freq: Once | INTRAMUSCULAR | Status: AC | PRN
  Administered 2014-09-05: 50 mL via ORAL

## 2014-09-05 MED ORDER — OXYCODONE-ACETAMINOPHEN 5-325 MG PO TABS: 1.0000 | ORAL_TABLET | ORAL | Status: DC | PRN

## 2014-09-05 MED ORDER — ONDANSETRON HCL 4 MG PO TABS: 4.0000 mg | ORAL_TABLET | Freq: Four times a day (QID) | ORAL | Status: AC

## 2014-09-05 NOTE — ED Notes (Signed)
Pt states she can not use the restroom at this time. Instructed pt to use call bell to inform us when she needed to void.

## 2014-09-05 NOTE — Discharge Instructions (Signed)

## 2014-09-05 NOTE — ED Provider Notes (Signed)
CSN: [id number]     Arrival date & time 09/05/14  1004 History   First MD Initiated Contact with Patient 09/05/14 1034     Chief Complaint  Patient presents with  . Abdominal Pain    vomiting x1     (Consider location/radiation/quality/duration/timing/severity/associated sxs/prior Treatment) HPI   38yF with LLQ pain. Acute onset a few hours ago. Persistent since. Just hurts. Associated nausea and vomiting x1. No fever or chills. No urinary complaints. No diarrhea. No hx of similar symptoms. No intervention prior to arrival.   Past Medical History  Diagnosis Date  . Anemia   . Asthma   . OCD (obsessive compulsive disorder)   . Eczema   . Migraine   . Anxiety   . Depression   . Hashimoto's disease    Past Surgical History  Procedure Laterality Date  . Rhinoplasty    . Septoplasty    . Cesarean section     History reviewed. No pertinent family history. History  Substance Use Topics  . Smoking status: Current Every Day Smoker -- 0.25 packs/day    Types: Cigarettes  . Smokeless tobacco: Never Used  . Alcohol Use: Yes     Comment: occas.   OB History    No data available     Review of Systems  All systems reviewed and negative, other than as noted in HPI.   Allergies  Review of patient's allergies indicates no known allergies.  Home Medications   Prior to Admission medications   Medication Sig Start Date End Date Taking? Authorizing Provider  cetirizine (ZYRTEC) 10 MG chewable tablet Chew 10 mg by mouth daily.   Yes Historical Provider, MD  clonazePAM (KLONOPIN) 2 MG tablet Take 2 mg by mouth 2 (two) times daily.   Yes Historical Provider, MD  Cyanocobalamin (VITAMIN B-12 CR PO) Take 1 tablet by mouth at bedtime.    Yes Historical Provider, MD  LATUDA 80 MG TABS tablet Take 80 mg by mouth daily with breakfast.  06/25/14  Yes Historical Provider, MD  levothyroxine (SYNTHROID, LEVOTHROID) 50 MCG tablet Take 50 mcg by mouth at bedtime.    Yes Historical Provider, MD   oxyCODONE-acetaminophen (PERCOCET) 7.5-325 MG per tablet Take 1 tablet by mouth every 8 (eight) hours as needed. Patient taking differently: Take 1 tablet by mouth every 8 (eight) hours as needed for moderate pain.  06/26/14  Yes Levert FeinsteinYijun Yan, MD  SUMAtriptan Succinate (SUMAVEL DOSEPRO) 6 MG/0.5ML SOTJ Inject 0.5 mLs (6 mg total) into the skin as needed. 04/24/13  Yes Nilda RiggsNancy Carolyn Martin, NP  topiramate (TOPAMAX) 100 MG tablet Take 1 tablet (100 mg total) by mouth 2 (two) times daily. 06/26/14  Yes Levert FeinsteinYijun Yan, MD  Diclofenac Potassium (CAMBIA) 50 MG PACK Take 50 mg by mouth as needed. Patient not taking: Reported on 09/05/2014 06/26/14   Levert FeinsteinYijun Yan, MD  nortriptyline (PAMELOR) 10 MG capsule Take 2 capsules (20 mg total) by mouth at bedtime. Patient not taking: Reported on 09/05/2014 06/26/14   Levert FeinsteinYijun Yan, MD   BP 125/73 mmHg  Pulse 70  Temp(Src) 98.7 F (37.1 C) (Oral)  Resp 16  SpO2 100%  LMP 08/22/2014 (Approximate) Physical Exam  Constitutional: She appears well-developed and well-nourished. No distress.  HENT:  Head: Normocephalic and atraumatic.  Eyes: Conjunctivae are normal. Right eye exhibits no discharge. Left eye exhibits no discharge.  Neck: Neck supple.  Cardiovascular: Normal rate, regular rhythm and normal heart sounds.  Exam reveals no gallop and no friction rub.  No murmur heard. Pulmonary/Chest: Effort normal and breath sounds normal. No respiratory distress.  Abdominal: Soft. She exhibits no distension. There is tenderness. There is no rebound and no guarding.  TTP LLQ w/o rebound or guarding.   Musculoskeletal: She exhibits no edema or tenderness.  Neurological: She is alert.  Skin: Skin is warm and dry.  Psychiatric: She has a normal mood and affect. Her behavior is normal. Thought content normal.  Nursing note and vitals reviewed.   ED Course  Procedures (including critical care time) Labs Review Labs Reviewed  CBC WITH DIFFERENTIAL/PLATELET - Abnormal; Notable for the  following:    WBC 13.5 (*)    Neutrophils Relative % 81 (*)    Neutro Abs 10.8 (*)    All other components within normal limits  COMPREHENSIVE METABOLIC PANEL - Abnormal; Notable for the following:    Glucose, Bld 129 (*)    ALT 10 (*)    All other components within normal limits  LIPASE, BLOOD  URINALYSIS, ROUTINE W REFLEX MICROSCOPIC (NOT AT [facility])  POC URINE PREG, ED    Imaging Review No results found.   Ct Abdomen Pelvis W Contrast  09/05/2014   CLINICAL DATA:  Left lower quadrant abdominal pain with nausea and vomiting today. Initial encounter.  EXAM: CT ABDOMEN AND PELVIS WITH CONTRAST  TECHNIQUE: Multidetector CT imaging of the abdomen and pelvis was performed using the standard protocol following bolus administration of intravenous contrast.  CONTRAST:  50mL OMNIPAQUE IOHEXOL 300 MG/ML SOLN, OMNIPAQUE IOHEXOL 300 MG/ML SOLN  COMPARISON:  Abdominal pelvic CT 08/21/2010.  FINDINGS: Lower chest: Mild dependent atelectasis at both lung bases. A small hiatal hernia is noted. There is no pleural or pericardial effusion.  Hepatobiliary: Mild periportal edema is noted within the liver. There is no focal liver lesion. There is no evidence of biliary dilatation. The gallbladder appears normal.  Pancreas: Unremarkable. No pancreatic ductal dilatation or surrounding inflammatory changes.  Spleen: Normal in size without focal abnormality.  Adrenals/Urinary Tract: Both adrenal glands appear normal.The right kidney appears normal. The left kidney demonstrates moderate hydronephrosis and hydroureter with delayed contrast excretion and perinephric soft tissue stranding. There is a probable tiny (approximately 1 mm) distal left ureteral calculus, best seen on coronal image number 58. The bladder is decompressed without definite abnormality.  Stomach/Bowel: No evidence of bowel wall thickening, distention or surrounding inflammatory change.  Vascular/Lymphatic: There are no enlarged abdominal or pelvic  lymph nodes. No significant vascular findings are present.  Reproductive: Small uterine fibroids and nabothian cysts noted. No suspicious adnexal findings. Probable collapsing right ovarian follicle.  Other: No evidence of abdominal wall mass or hernia.  Musculoskeletal: No acute or significant osseous findings.  IMPRESSION: 1. Partially obstructing tiny distal left ureteral calculus with associated hydronephrosis and delayed contrast excretion. No other urinary tract calculi identified. 2. Small uterine fibroids.   Electronically Signed   By: Carey Bullocks M.D.   On: 09/05/2014 13:07    EKG Interpretation None      MDM   Final diagnoses:  Ureteral colic    38yF with LLQ pain. CT with distal L ureteral stone. At this point, plan expectant management. It has been determined that no acute conditions requiring further emergency intervention are present at this time. The patient has been advised of the diagnosis and plan. I reviewed any labs and imaging including any potential incidental findings. We have discussed signs and symptoms that warrant return to the ED and they are listed in the discharge  instructions.      Raeford Razor, MD 09/13/14 [phone]

## 2014-09-05 NOTE — ED Notes (Signed)
Patient c/o LLQ abd pain, onset 2 hours ago. +nausea, emesis x1, denies diarrhea. Patient denies sick contact, did not take any medications for these symptoms PTA.

## 2014-09-05 NOTE — ED Notes (Signed)
Patient transported to CT 

## 2014-09-25 ENCOUNTER — Telehealth: Payer: Self-pay | Admitting: Neurology

## 2014-09-25 NOTE — Telephone Encounter (Signed)
Spoke to patient - scheduled her an earlier appointment with Dr. Terrace Arabia to discuss her migraine management plan.

## 2014-09-25 NOTE — Telephone Encounter (Addendum)
Patient is requesting a refill on Oxycodone 7.5/325mg .  Last OV on 03/22 says: Refill percocet 30 tablets for 3 months It appears a Rx for Oxycodone 5/325mg  written by ED for a quantity of 15 on 06/01.  Would you like to refill Oxy 7.5/325mg  at this time?  Please advise.  Thank you.     Chart reviewed, last seen in March 2016, for chronic migraine, also with comorbidity of depression anxiety, I will not write Percocet for her, Levert Feinstein, M.D. Ph.D.  [facility] [address] [place], [place] [address] Phone: [phone] Fax:      [phone]

## 2014-09-25 NOTE — Telephone Encounter (Signed)
Patient is calling as her pharmacy will not refill her Percocet 7.5-325 mg.  She states she needs this for her headaches.  Please call.

## 2014-09-25 NOTE — Telephone Encounter (Signed)
I called back.  Got no answer.  Left message relaying providers note.   

## 2014-09-25 NOTE — Telephone Encounter (Signed)
Patient called requesting a refill for oxyCODONE-acetaminophen (PERCOCET) 7.5-325 MG per tablet . Patient advised RX will be ready within 24 hours unless otherwise informed by RN.

## 2014-10-04 ENCOUNTER — Encounter: Payer: Self-pay | Admitting: Neurology

## 2014-10-04 ENCOUNTER — Ambulatory Visit (INDEPENDENT_AMBULATORY_CARE_PROVIDER_SITE_OTHER): Payer: 59 | Admitting: Neurology

## 2014-10-04 VITALS — BP 123/80 | HR 75 | Ht 66.0 in | Wt 136.0 lb

## 2014-10-04 DIAGNOSIS — F341 Dysthymic disorder: Secondary | ICD-10-CM | POA: Diagnosis not present

## 2014-10-04 DIAGNOSIS — G43009 Migraine without aura, not intractable, without status migrainosus: Secondary | ICD-10-CM | POA: Diagnosis not present

## 2014-10-04 DIAGNOSIS — R4781 Slurred speech: Secondary | ICD-10-CM | POA: Diagnosis not present

## 2014-10-04 MED ORDER — OXYCODONE-ACETAMINOPHEN 7.5-325 MG PO TABS: 1.0000 | ORAL_TABLET | Freq: Three times a day (TID) | ORAL | Status: DC | PRN

## 2014-10-04 NOTE — Progress Notes (Signed)
[facility]  PATIENT: Sheila Woods DOB: 1975-06-09   REASON FOR VISIT: Followup for migraine   HISTORY OF PRESENT ILLNESS:Ms Baby, 39 year-old female returns for followup of her chronic migraine.  She has PMHx of OCD, iron deficiency anemia, depression presenting with chronic migraines.She had headaches since age 56, migraine described as severe stabbing pain, lateralized, radiating to her jaw, often proceed by visual changes, loss of peripheral vision.  sometimes, oculomigraine alone without significant headahces. Headache lasted 2 hours, with preventive Topamax, she only has 1-2 every month,  She is taking percocet 7.5/325 prn and sumavel prn. Tired imitrex injection, and other triptan in the past including imitrex po, maxalt, relpax, zomig, triximex,without help.  She has tried other preventive medications in the past including pamelor, effexor, elavil, nortriptyline, depakote without help either.  07/16/11: Pt reports migraines are better, she has been taking an over the counter preparation caled Limu. She claims she is taking her Topamax as well even though Dr. Terrace Arabia received a letter from [employer] [facility] stating her compliance with meds was less than 70%.She did not do Botox because of her co pay. She has seen ENT Dr. Jearld Fenton who wanted her to have CT to see if polyps had returned but she did not want to go through another surgery. She has been placed on abilify by psych. She recently got married. No new neurologic complaints.   04/22/12: Returns for followup. Has been off her Topamax for several months because she ran out. She has had a daily headache since that time. She has stopped her Propranolol. She continues to see psych for her anxiety, depression. She works out of the home.     04/2013: She has a history of migraines and is currently on Topamax 150 daily. She has Sumavel ordered acutely, but due to cost has been taking Percocet. She was recently diagnosed as  hypothyroid. She continues to have adjustments in her medication. Headaches are in fairly good control. She returns for reevaluation  UPDATE March 22nd 2016: She still has headache 3 times a week, retrorbital area, occipital region, pounding, light noise sensitive, nauseous,  She has to take off from work, up to 2 days a week, she is on Topamax 150 mg daily, denies significant side effect, she used about 30 tablets of Percocet in one month, previously tried different triptan's, including Imitrex, Maxalt, Zomig, Relpax, without helping, currently taking Imitrex injection as needed, not sure if this truly helping her, Percocet helps her temporarily, she is continue combating with significant depression, is taking Latuda and Clonazepam  UPDATE October 04 2014: She still has frequent headaches, up to 4 severe migraine each week, she used to take Percocet as needed,   Higher dose of Topamax caused slurred speech, she is currently taking 100 mg daily, also tried Media planner since last visit in March which did not help her, Verlin Fester helps her only partially.  She works for [employer], require computer work, and talking on the phone, since April 2016, she noticed slurred speech,  Laboratory evaluation in June 2016 showed normal CMP, CBC, with exception of mild elevated glucose 130, WBC of 13 point 9  She also suffered severe depression, is under the care of Dr. Tawanna Sat, she is taking Latuda 80 mg daily, clonazepam 1 mg twice a day  I reviewed her emergency room notes in June first 2016 for left kidney stone,  CT abdomen in June 2016 showed: Partially obstructing tiny distal left ureteral calculus with associated  hydronephrosis and delayed contrast excretion. No other urinary tract calculi identified.  Small uterine fibroids.  REVIEW OF SYSTEMS: Full 14 system review of systems performed and notable only for those listed, all others are neg: Fatigue, trouble swallowing, light  sensitivity, insomnia, frequent awakening, snoring, joints pain, headaches, speech difficulty, depression anxiety   ALLERGIES: No Known Allergies  HOME MEDICATIONS: Outpatient Prescriptions Prior to Visit  Medication Sig Dispense Refill  . cetirizine (ZYRTEC) 10 MG chewable tablet Chew 10 mg by mouth daily.    . clonazePAM (KLONOPIN) 2 MG tablet Take 2 mg by mouth 2 (two) times daily.    . Cyanocobalamin (VITAMIN B-12 CR PO) Take 1 tablet by mouth at bedtime.     . Diclofenac Potassium (CAMBIA) 50 MG PACK Take 50 mg by mouth as needed. 15 each 6  . LATUDA 80 MG TABS tablet Take 80 mg by mouth daily with breakfast.     . nortriptyline (PAMELOR) 10 MG capsule Take 2 capsules (20 mg total) by mouth at bedtime. 60 capsule 11  . ondansetron (ZOFRAN) 4 MG tablet Take 1 tablet (4 mg total) by mouth every 6 (six) hours. 12 tablet 0  . oxyCODONE-acetaminophen (PERCOCET) 7.5-325 MG per tablet Take 1 tablet by mouth every 8 (eight) hours as needed. (Patient taking differently: Take 1 tablet by mouth every 8 (eight) hours as needed for moderate pain. ) 30 tablet 0  . topiramate (TOPAMAX) 100 MG tablet Take 1 tablet (100 mg total) by mouth 2 (two) times daily. 60 tablet 11  . SUMAtriptan Succinate (SUMAVEL DOSEPRO) 6 MG/0.5ML SOTJ Inject 0.5 mLs (6 mg total) into the skin as needed. 4 Syringe 6  . levothyroxine (SYNTHROID, LEVOTHROID) 50 MCG tablet Take 50 mcg by mouth at bedtime.     Marland Kitchen. oxyCODONE-acetaminophen (PERCOCET/ROXICET) 5-325 MG per tablet Take 1-2 tablets by mouth every 4 (four) hours as needed for severe pain. 15 tablet 0   No facility-administered medications prior to visit.    PAST MEDICAL HISTORY: Past Medical History  Diagnosis Date  . Anemia   . Asthma   . OCD (obsessive compulsive disorder)   . Eczema   . Migraine   . Anxiety   . Depression   . Hashimoto's disease     PAST SURGICAL HISTORY: Past Surgical History  Procedure Laterality Date  . Rhinoplasty    . Septoplasty     . Cesarean section      FAMILY HISTORY: No family history on file.  SOCIAL HISTORY: History   Social History  . Marital Status: Married    Spouse Name: Weston Brassick  . Number of Children: 1  . Years of Education: college   Occupational History  .  [employer]   Social History Main Topics  . Smoking status: Current Every Day Smoker -- 0.25 packs/day    Types: Cigarettes  . Smokeless tobacco: Never Used  . Alcohol Use: Yes     Comment: occas.  . Drug Use: No     Comment: quit over 20 years ago  . Sexual Activity: Yes    Birth Control/ Protection: Condom   Other Topics Concern  . Not on file   Social History Narrative   Patient is married [name](Nick) and has one child.   Patient works from home for [employer].   Patient has a college education.   Patient drinks one cup of caffeine daily.           PHYSICAL EXAM  Filed Vitals:   10/04/14  1519  BP: 123/80  Pulse: 75  Height: 5\' 6"  (1.676 m)  Weight: 136 lb (61.689 kg)   Body mass index is 21.96 kg/(m^2).   PHYSICAL EXAMNIATION:  Gen: NAD, conversant, well nourised, obese, well groomed                     Cardiovascular: Regular rate rhythm, no peripheral edema, warm, nontender. Eyes: Conjunctivae clear without exudates or hemorrhage Neck: Supple, no carotid bruise. Pulmonary: Clear to auscultation bilaterally   NEUROLOGICAL EXAM:  MENTAL STATUS: Speech:    Speech is normal; fluent and spontaneous with normal comprehension.  Cognition: Depressed looking middle-age female    The patient is oriented to person, place, and time;     recent and remote memory intact;     language fluent;     normal attention, concentration,     fund of knowledge.  CRANIAL NERVES: CN II: Visual fields are full to confrontation. Fundoscopic exam is normal with sharp discs and no vascular changes. Pupil equal round reactive to light. CN III, IV, VI: extraocular movement are normal. No ptosis. CN V: Facial sensation is  intact to pinprick in all 3 divisions bilaterally. Corneal responses are intact.  CN VII: Face is symmetric with normal eye closure and smile. CN VIII: Hearing is normal to rubbing fingers CN IX, X: Palate elevates symmetrically. Phonation is normal. CN XI: Head turning and shoulder shrug are intact CN XII: Tongue is midline with normal movements and no atrophy.  MOTOR: There is no pronator drift of out-stretched arms. Muscle bulk and tone are normal. Muscle strength is normal.   Shoulder abduction Shoulder external rotation Elbow flexion Elbow extension Wrist flexion Wrist extension Finger abduction Hip flexion Knee flexion Knee extension Ankle dorsi flexion Ankle plantar flexion  R 5 5 5 5 5 5 5 5 5 5 5 5   L 5 5 5 5 5 5 5 5 5 5 5 5     REFLEXES: Reflexes are 2+ and symmetric at the biceps, triceps, knees, and ankles. Plantar responses are flexor.  SENSORY: Light touch, pinprick, position sense, and vibration sense are intact in fingers and toes.  COORDINATION: Rapid alternating movements and fine finger movements are intact. There is no dysmetria on finger-to-nose and heel-knee-shin. There are no abnormal or extraneous movements.   GAIT/STANCE: Posture is normal. Gait is steady with normal steps, base, arm swing, and turning. Heel and toe walking are normal. Tandem gait is normal.  Romberg is absent.  DIAGNOSTIC DATA (LABS, IMAGING, TESTING) -None to review  ASSESSMENT AND PLAN  Chronic migraine:   Continue nortriptyline 20 mg every night as preventative medications,   Cambia as needed, refill Percocet today, for 3 months  New-onset stuttering of speech:   MRI of brain Worsening Depression:      Levert Feinstein, M.D. Ph.D.  [facility] [address] [place], [place] [address] Phone: [phone] Fax:      [phone]

## 2014-10-24 ENCOUNTER — Other Ambulatory Visit: Payer: 59

## 2014-10-31 ENCOUNTER — Ambulatory Visit (INDEPENDENT_AMBULATORY_CARE_PROVIDER_SITE_OTHER): Payer: 59

## 2014-10-31 DIAGNOSIS — R4781 Slurred speech: Secondary | ICD-10-CM

## 2014-10-31 DIAGNOSIS — G43009 Migraine without aura, not intractable, without status migrainosus: Secondary | ICD-10-CM

## 2014-10-31 DIAGNOSIS — F341 Dysthymic disorder: Secondary | ICD-10-CM

## 2014-11-01 ENCOUNTER — Telehealth: Payer: Self-pay | Admitting: *Deleted

## 2014-11-01 NOTE — Telephone Encounter (Signed)
Spoke to [facility] - she is aware of normal results.

## 2014-11-01 NOTE — Telephone Encounter (Signed)
-----   Message from Levert Feinstein, MD sent at 11/01/2014  5:49 PM EDT ----- Please call patient for normal MRi brain

## 2014-11-13 ENCOUNTER — Ambulatory Visit (INDEPENDENT_AMBULATORY_CARE_PROVIDER_SITE_OTHER): Payer: 59 | Admitting: Neurology

## 2014-11-13 ENCOUNTER — Encounter: Payer: Self-pay | Admitting: Neurology

## 2014-11-13 VITALS — BP 108/64 | HR 64 | Ht 66.0 in | Wt 135.0 lb

## 2014-11-13 DIAGNOSIS — G43009 Migraine without aura, not intractable, without status migrainosus: Secondary | ICD-10-CM | POA: Diagnosis not present

## 2014-11-13 DIAGNOSIS — F341 Dysthymic disorder: Secondary | ICD-10-CM

## 2014-11-13 MED ORDER — FROVATRIPTAN SUCCINATE 2.5 MG PO TABS: 2.5000 mg | ORAL_TABLET | ORAL | Status: DC | PRN

## 2014-11-13 MED ORDER — LAMOTRIGINE 25 MG PO TABS: ORAL_TABLET | ORAL | Status: DC

## 2014-11-13 NOTE — Progress Notes (Signed)
Chief Complaint  Patient presents with  . Migraine    She is still getting migraines three times weekly.  She discontinued use of nortriptyline after 5-6 weeks because she did not notice an improvement.  She is still taking Topamax 100mg  BID but she feels this medication is responsible for her slurred speech.  Verlin Fester is only mildy helpful.  She gets the best relief with 0.5 - 1 tablets of oxycodone 7.5-325mg .       [facility]  PATIENT: Sheila Woods DOB: [birth year]   REASON FOR VISIT: Followup for migraine   HISTORY OF PRESENT ILLNESS:Sheila Woods, 39 year-old female returns for followup of her chronic migraine.  She has PMHx of OCD, iron deficiency anemia, depression presenting with chronic migraines.She had headaches since age 31, migraine described as severe stabbing pain, lateralized, radiating to her jaw, often proceed by visual changes, loss of peripheral vision.  sometimes, oculomigraine alone without significant headahces. Headache lasted 2 hours, with preventive Topamax, she only has 1-2 every month,  She is taking percocet 7.5/325 prn and sumavel prn. Tired imitrex injection, and other triptan in the past including imitrex po, maxalt, relpax, zomig, triximex,without help.  She has tried other preventive medications in the past including pamelor, effexor, elavil, nortriptyline, depakote without help either.  07/16/11: Pt reports migraines are better, she has been taking an over the counter preparation caled Limu. She claims she is taking her Topamax as well even though Dr. Terrace Arabia received a letter from [employer] stating her compliance with meds was less than 70%.She did not do Botox because of her co pay. She has seen ENT Dr. Jearld Fenton who wanted her to have CT to see if polyps had returned but she did not want to go through another surgery. She has been placed on abilify by psych. She recently got married. No new neurologic complaints.   04/22/12: Returns for  followup. Has been off her Topamax for several months because she ran out. She has had a daily headache since that time. She has stopped her Propranolol. She continues to see psych for her anxiety, depression. She works out of the home.     04/2013: She has a history of migraines and is currently on Topamax 150 daily. She has Sumavel ordered acutely, but due to cost has been taking Percocet. She was recently diagnosed as hypothyroid. She continues to have adjustments in her medication. Headaches are in fairly good control. She returns for reevaluation  UPDATE March 22nd 2016: She still has headache 3 times a week, retrorbital area, occipital region, pounding, light noise sensitive, nauseous,  She has to take off from work, up to 2 days a week, she is on Topamax 150 mg daily, denies significant side effect, she used about 30 tablets of Percocet in one month, previously tried different triptan's, including Imitrex, Maxalt, Zomig, Relpax, without helping, currently taking Imitrex injection as needed, not sure if this truly helping her, Percocet helps her temporarily, she is continue combating with significant depression, is taking Latuda and Clonazepam  UPDATE October 04 2014: She still has frequent headaches, up to 4 severe migraine each week, she used to take Percocet as needed,   Higher dose of Topamax caused slurred speech, she is currently taking 100 mg daily, also tried Media planner since last visit in March which did not help her, Verlin Fester helps her only partially.  She works for [employer], require computer work, and talking on the phone, since April 2016, she noticed slurred  speech,  Laboratory evaluation in June 2016 showed normal CMP, CBC, with exception of mild elevated glucose 130, WBC of 13 point 9  She also suffered severe depression, is under the care of Dr. Tawanna Sat, she is taking Latuda 80 mg daily, clonazepam 1 mg twice a day  I reviewed her emergency room notes in  June first 2016 for left kidney stone,  CT abdomen in June 2016 showed: Partially obstructing tiny distal left ureteral calculus with associated hydronephrosis and delayed contrast excretion. No other urinary tract calculi identified.  Small uterine fibroids.  UPDATE August 9th 2016: We have reviewed MRI of the brain that was normal, she slides down steps today, landed on her buttock, with bruising of her arms  She complains of worsening headaches over the past 2 days, constant, moderate, light noise sensitivity, nauseous, she has to take today off from work, she has tried multiple dose of Cambia and Percocet without help  CAT scan in June showed evidence of kidney stone, Topamax is no longer a good choice, she also complains of mental cloudiness was Topamax, require a depressed, I have discussed treatment options, decided to switch her to lamotrigine, which can help her depression  REVIEW OF SYSTEMS: Full 14 system review of systems performed and notable only for those listed, all others are neg: Light sensitivity, ear pain, nausea, insomnia, frequent awakening, daytime sleepiness, snoring, joint pain, joint swelling, bruises easily, headache speech difficulty depression anxiety   ALLERGIES: No Known Allergies  HOME MEDICATIONS: Outpatient Prescriptions Prior to Visit  Medication Sig Dispense Refill  . cetirizine (ZYRTEC) 10 MG chewable tablet Chew 10 mg by mouth daily.    . clonazePAM (KLONOPIN) 2 MG tablet Take 2 mg by mouth 2 (two) times daily.    . Cyanocobalamin (VITAMIN B-12 CR PO) Take 1 tablet by mouth at bedtime.     . Diclofenac Potassium (CAMBIA) 50 MG PACK Take 50 mg by mouth as needed. 15 each 6  . LATUDA 80 MG TABS tablet Take 80 mg by mouth daily with breakfast.     . levothyroxine (SYNTHROID, LEVOTHROID) 88 MCG tablet     . ondansetron (ZOFRAN) 4 MG tablet Take 1 tablet (4 mg total) by mouth every 6 (six) hours. 12 tablet 0  . oxyCODONE-acetaminophen (PERCOCET) 7.5-325 MG  per tablet Take 1 tablet by mouth every 8 (eight) hours as needed for moderate pain. 30 tablet 0  . topiramate (TOPAMAX) 100 MG tablet Take 1 tablet (100 mg total) by mouth 2 (two) times daily. 60 tablet 11  . nortriptyline (PAMELOR) 10 MG capsule Take 2 capsules (20 mg total) by mouth at bedtime. 60 capsule 11   No facility-administered medications prior to visit.    PAST MEDICAL HISTORY: Past Medical History  Diagnosis Date  . Anemia   . Asthma   . OCD (obsessive compulsive disorder)   . Eczema   . Migraine   . Anxiety   . Depression   . Hashimoto's disease     PAST SURGICAL HISTORY: Past Surgical History  Procedure Laterality Date  . Rhinoplasty    . Septoplasty    . Cesarean section      FAMILY HISTORY: No family history on file.  SOCIAL HISTORY: History   Social History  . Marital Status: Married    Spouse Name: [name]  . Number of Children: 1  . Years of Education: college   Occupational History  .  [employer]   Social History Main Topics  . Smoking status:  Current Every Day Smoker -- 0.25 packs/day    Types: Cigarettes  . Smokeless tobacco: Never Used  . Alcohol Use: Yes     Comment: occas.  . Drug Use: No     Comment: quit over 20 years ago  . Sexual Activity: Yes    Birth Control/ Protection: Condom   Other Topics Concern  . Not on file   Social History Narrative   Patient is married [name]) and has one child.   Patient works from home for [employer].   Patient has a college education.   Patient drinks one cup of caffeine daily.           PHYSICAL EXAM  Filed Vitals:   11/13/14 1611  BP: 108/64  Pulse: 64  Height:  (1.676 m)  Weight: 135 lb (61.236 kg)   Body mass index is 21.8 kg/(m^2).   PHYSICAL EXAMNIATION:  Gen: NAD, conversant, well nourised, obese, well groomed                     Cardiovascular: Regular rate rhythm, no peripheral edema, warm, nontender. Eyes: Conjunctivae clear without exudates or  hemorrhage Neck: Supple, no carotid bruise. Pulmonary: Clear to auscultation bilaterally   NEUROLOGICAL EXAM:  MENTAL STATUS: Speech:    Speech is normal; fluent and spontaneous with normal comprehension.  Cognition: Depressed looking middle-age female    The patient is oriented to person, place, and time;     recent and remote memory intact;     language fluent;     normal attention, concentration,     fund of knowledge.  CRANIAL NERVES: CN II: Visual fields are full to confrontation. Fundoscopic exam is normal with sharp discs and no vascular changes. Pupil equal round reactive to light. CN III, IV, VI: extraocular movement are normal. No ptosis. CN V: Facial sensation is intact to pinprick in all 3 divisions bilaterally. Corneal responses are intact.  CN VII: Face is symmetric with normal eye closure and smile. CN VIII: Hearing is normal to rubbing fingers CN IX, X: Palate elevates symmetrically. Phonation is normal. CN XI: Head turning and shoulder shrug are intact CN XII: Tongue is midline with normal movements and no atrophy.  MOTOR: There is no pronator drift of out-stretched arms. Muscle bulk and tone are normal. Muscle strength is normal.  REFLEXES: Reflexes are 2+ and symmetric at the biceps, triceps, knees, and ankles. Plantar responses are flexor.  SENSORY: Light touch, pinprick, position sense, and vibration sense are intact in fingers and toes.  COORDINATION: Rapid alternating movements and fine finger movements are intact. There is no dysmetria on finger-to-nose and heel-knee-shin.   GAIT/STANCE: Posture is normal. Gait is steady with normal steps, base, arm swing, and turning. Heel and toe walking are normal. Tandem gait is normal.  Romberg is absent.  DIAGNOSTIC DATA (LABS, IMAGING, TESTING) -None to review  ASSESSMENT AND PLAN  Chronic migraine:   Start lamotrigine titrating to 25 mg 3 tablets twice a day,   Previously she has tried and failed  Topamax, mental fogginess, nortriptyline, no help, verapamil, beta blocker  Frova as needed  I also suggested her call office for worsening headaches, may consider IV Depacon, Solu-Medrol, nerve blocks Worsening Depression:  Lamotrigine should help  I also advised her continue follow-up with her primary care and psychiatrist,    Levert Feinstein, M.D. Ph.D.  [facility] [address] [place], [place] [address] Phone: [phone] Fax:      [phone]

## 2014-11-14 ENCOUNTER — Telehealth: Payer: Self-pay | Admitting: Neurology

## 2014-11-14 NOTE — Telephone Encounter (Signed)
Sheila Woods with [employer] is calling to verify a Rx that was sent in for the patient for lamoTRIgine (LAMICTAL) 25 MG tablet. Please call and advise. Thank you.

## 2014-11-14 NOTE — Telephone Encounter (Signed)
Called [facility] [phone]) - generic Frova is $55 and the insurance will only cover #4 tabs.  They said her insurance company could be contacted for a quantity override.

## 2014-11-14 NOTE — Telephone Encounter (Signed)
The patient said that the Rx that Dr. Terrace Arabia prescribed frovatriptan was too expensive and was wondering if she could get something else that is not so expensive. The best number to contact is  [phone]

## 2014-11-14 NOTE — Telephone Encounter (Signed)
OV note says: Start lamotrigine titrating to 25 mg 3 tablets twice a day I called back.  Spoke with Fleet Contras.  Verified order.  They will fill as prescribed and call back if anything further is needed.

## 2014-11-14 NOTE — Telephone Encounter (Signed)
Ins has been contacted and provided with clinical info.  Request is under review Ref # [id number]

## 2014-11-15 NOTE — Telephone Encounter (Signed)
Ins responded stating Frovatriptan is covered under the plan, for 4 tablets every 30 days.  They indicated "medication quantities above the benefit limit are excluded from the plan'"  They will not grant an exception for additional tablets.  Patient's co-pay is $55.  She would like to know if a less expensive alternative could be prescribed.  Please advise.  Thank you.

## 2014-11-16 NOTE — Telephone Encounter (Signed)
She has tried and failed different triptans in the past, Imitrex, Maxalt, zomig, Relpax.May advise her continue Cambia as needed, if she has prolonged, intractable headaches, may offer her iv Depacon infusion

## 2014-11-19 NOTE — Telephone Encounter (Signed)
Spoke to patient - she is agreeable and verbalized understanding to the plan outlined below.

## 2014-11-21 ENCOUNTER — Telehealth: Payer: Self-pay | Admitting: Neurology

## 2014-11-21 NOTE — Telephone Encounter (Signed)
Would you like to prescribe an alternate medication for her?

## 2014-11-21 NOTE — Telephone Encounter (Signed)
Pt has started new rx, lamoTRIgine (LAMICTAL) 25 MG tablet, states that she has had this medication before and it increases her migraines. She is going to stop taking the medication. Please call and advise [phone]

## 2014-11-22 NOTE — Telephone Encounter (Signed)
She is agreeable to stop Lamictal.  Encouraged her to make an appt with the PCP or psychiatrist to discuss her depression - said she will call her psychiatrist for an appt.

## 2014-11-22 NOTE — Telephone Encounter (Signed)
I just saw her in November 13 2014, patient has signs of severe depression, it is okay to stop lamotrigine if this does not help her headaches, she has tried and failed multiple preventive medications in the past, beta blocker, calcium channel blocker, Topamax, nortriptyline,  Please suggest her continue follow-up with primary care or psychiatrist, her comorbidity of depression likely contributed to her headaches as well

## 2014-12-14 ENCOUNTER — Other Ambulatory Visit: Payer: Self-pay | Admitting: Neurology

## 2014-12-14 MED ORDER — OXYCODONE-ACETAMINOPHEN 7.5-325 MG PO TABS: 1.0000 | ORAL_TABLET | Freq: Three times a day (TID) | ORAL | Status: DC | PRN

## 2014-12-14 MED ORDER — DICLOFENAC POTASSIUM(MIGRAINE) 50 MG PO PACK
50.0000 mg | PACK | ORAL | Status: DC | PRN
Start: 2014-12-14 — End: 2015-02-19

## 2014-12-14 NOTE — Telephone Encounter (Signed)
Patient called to request refill on oxyCODONE-acetaminophen (PERCOCET) 7.5-325 MG per tablet and wanted to advise that she uses with Diclofenac Potassium (CAMBIA) 50 MG PACK

## 2014-12-14 NOTE — Telephone Encounter (Signed)
Rx entered, forwarded to provider for review.

## 2014-12-17 ENCOUNTER — Telehealth: Payer: Self-pay | Admitting: *Deleted

## 2014-12-17 ENCOUNTER — Telehealth: Payer: Self-pay | Admitting: Neurology

## 2014-12-17 ENCOUNTER — Encounter: Payer: Self-pay | Admitting: *Deleted

## 2014-12-17 NOTE — Telephone Encounter (Signed)
Oxycodone rx signed in up front for pick up.

## 2014-12-17 NOTE — Telephone Encounter (Signed)
Pt's pharmacy called and is concerned that pt has other narcotic medication. Pt has a rx for clonazePAM (KLONOPIN) 1 mg , 1 tablet 2 x a day.   cyclobenzaprina is also ordered from another dr. Algis Downs pharmacy to await a call from this office regarding rx .  [phone] (Phone

## 2014-12-17 NOTE — Telephone Encounter (Signed)
Spoke to pharmacy - per Dr. Terrace Arabia, do not fill oxycodone.  Patient aware.

## 2014-12-17 NOTE — Telephone Encounter (Signed)
Spoke to Sheila Woods - she insist that she only uses cyclobenzprine when needed (prescribed by her PCP), clonazepam for anxiety (prescribed by her psychiatrist) and oxycodone for her migraines, as needed.  She said she has been on all three medications for a long time without any problems with interactions.  Says she does not abuse oxycodone or ask for refills in unreasonable time frames.  Said she has failed other treatments and this is the only thing, along with Cambia, that works for her severe migraines.  Said she is unable to come to the office for treatments for acute migraines because her employer would penalize her for the additional time off. She has the prescription in her possession but says she will not fill it until she is able to speak with Dr. Terrace Arabia and get permission to continue the therapy.

## 2014-12-18 NOTE — Telephone Encounter (Signed)
Dr. Terrace Arabia has spoken directly with the patient and reviewed her chart.  Per her verbal orders, it ok for the pharmacy to fill her oxycodone prescription.  Called the [facility] [phone]) and spoke to Chole, who will make a note in her chart.

## 2014-12-18 NOTE — Telephone Encounter (Addendum)
I have tried to contact patient twice without success,  Reviewed previous chart, previously her migraine was somewhat helped by Topamax, but developed kidney stone, not a good candidate for Topamax  She has tried and failed different triptan seen in the past, currently taking cambia as needed, sometimes require half to one tablets of Percocet with cambia She has been followed up with her psychiatrist Salome Holmes, has a follow-up visit next week, currently taking Latuda 80 mg daily, clonazepam every night for depression anxiety  I agreed to refill her Percocet prescription this time, again emphasized the importance of cutback daily analgesic use, she has follow-up appointment January 14 2015

## 2014-12-18 NOTE — Telephone Encounter (Signed)
Patient called back to speak to Dr. Terrace Arabia. Patient states nurse told her that Dr. Terrace Arabia would call her this morning and she hasn't heard from Dr. Terrace Arabia.

## 2014-12-28 ENCOUNTER — Ambulatory Visit: Payer: 59 | Admitting: Nurse Practitioner

## 2015-01-14 ENCOUNTER — Telehealth: Payer: Self-pay | Admitting: *Deleted

## 2015-01-14 ENCOUNTER — Ambulatory Visit: Payer: 59 | Admitting: Neurology

## 2015-01-14 NOTE — Telephone Encounter (Signed)
No showed follow up appt. 

## 2015-01-15 ENCOUNTER — Encounter: Payer: Self-pay | Admitting: Neurology

## 2015-01-15 DIAGNOSIS — Z0289 Encounter for other administrative examinations: Secondary | ICD-10-CM

## 2015-01-18 ENCOUNTER — Telehealth: Payer: Self-pay | Admitting: Neurology

## 2015-01-18 NOTE — Telephone Encounter (Signed)
Spoke to patient. She said she will have her daughter to bring her.

## 2015-01-18 NOTE — Telephone Encounter (Signed)
Please call and have her come in asap thanks

## 2015-01-18 NOTE — Telephone Encounter (Signed)
Pt called and has had a migraine or 2 days and would like to come in to have an infusion today. She can be her with in 20 min. An order just needs to be put in and notification to the pt. Thank you

## 2015-02-19 ENCOUNTER — Ambulatory Visit (INDEPENDENT_AMBULATORY_CARE_PROVIDER_SITE_OTHER): Payer: 59 | Admitting: Neurology

## 2015-02-19 ENCOUNTER — Encounter: Payer: Self-pay | Admitting: Neurology

## 2015-02-19 VITALS — BP 112/62 | HR 78 | Ht 66.0 in | Wt 132.0 lb

## 2015-02-19 DIAGNOSIS — G43009 Migraine without aura, not intractable, without status migrainosus: Secondary | ICD-10-CM

## 2015-02-19 DIAGNOSIS — F341 Dysthymic disorder: Secondary | ICD-10-CM | POA: Diagnosis not present

## 2015-02-19 MED ORDER — OXYCODONE-ACETAMINOPHEN 7.5-325 MG PO TABS: 1.0000 | ORAL_TABLET | Freq: Three times a day (TID) | ORAL | Status: DC | PRN

## 2015-02-19 NOTE — Progress Notes (Signed)
Chief Complaint  Patient presents with  . Migraine    Reports still getting migraines three times weekly. She stopped taking Lamictal because she felt the medication worsened her migraines.  She uses oxycodone and diclofenac to treat her pain.     [facility]  PATIENT: Sheila Woods DOB: 02-13-1976   REASON FOR VISIT: Followup for migraine   HISTORY OF PRESENT ILLNESS:Ms Skillern, 39 year-old female returns for followup of her chronic migraine.  She has PMHx of OCD, iron deficiency anemia, depression presenting with chronic migraines.She had headaches since age 50, migraine described as severe stabbing pain, lateralized, radiating to her jaw, often proceed by visual changes, loss of peripheral vision.  sometimes, oculomigraine alone without significant headahces. Headache lasted 2 hours, with preventive Topamax, she only has 1-2 every month,  She is taking percocet 7.5/325 prn and sumavel prn. Tired imitrex injection, and other triptan in the past including imitrex po, maxalt, relpax, zomig, triximex,without help.  She has tried other preventive medications in the past including pamelor, effexor, elavil, nortriptyline, depakote without help either.  07/16/11: Pt reports migraines are better, she has been taking an over the counter preparation caled Limu. She claims she is taking her Topamax as well even though Dr. Terrace Arabia received a letter from [employer] stating her compliance with meds was less than 70%.She did not do Botox because of her co pay. She has seen ENT Dr. Jearld Fenton who wanted her to have CT to see if polyps had returned but she did not want to go through another surgery. She has been placed on abilify by psych. She recently got married. No new neurologic complaints.   04/22/12: Returns for followup. Has been off her Topamax for several months because she ran out. She has had a daily headache since that time. She has stopped her Propranolol. She continues to see  psych for her anxiety, depression. She works out of the home.     04/2013: She has a history of migraines and is currently on Topamax 150 daily. She has Sumavel ordered acutely, but due to cost has been taking Percocet. She was recently diagnosed as hypothyroid. She continues to have adjustments in her medication. Headaches are in fairly good control. She returns for reevaluation  UPDATE March 22nd 2016: She still has headache 3 times a week, retrorbital area, occipital region, pounding, light noise sensitive, nauseous,  She has to take off from work, up to 2 days a week, she is on Topamax 150 mg daily, denies significant side effect, she used about 30 tablets of Percocet in one month, previously tried different triptan's, including Imitrex, Maxalt, Zomig, Relpax, without helping, currently taking Imitrex injection as needed, not sure if this truly helping her, Percocet helps her temporarily, she is continue combating with significant depression, is taking Latuda and Clonazepam  UPDATE October 04 2014: She still has frequent headaches, up to 4 severe migraine each week, she used to take Percocet as needed,   Higher dose of Topamax caused slurred speech, she is currently taking 100 mg daily, also tried Media planner since last visit in March which did not help her, Verlin Fester helps her only partially.  She works for [employer], require computer work, and talking on the phone, since April 2016, she noticed slurred speech,  Laboratory evaluation in June 2016 showed normal CMP, CBC, with exception of mild elevated glucose 130, WBC of 13 point 9  She also suffered severe depression, is under the care of Dr. Tawanna Sat,  she is taking Latuda 80 mg daily, clonazepam 1 mg twice a day  I reviewed her emergency room notes in June first 2016 for left kidney stone,  CT abdomen in June 2016 showed: Partially obstructing tiny distal left ureteral calculus with associated hydronephrosis and delayed  contrast excretion. No other urinary tract calculi identified.  Small uterine fibroids.  UPDATE August 9th 2016: We have reviewed MRI of the brain that was normal, she slides down steps today, landed on her buttock, with bruising of her arms  She complains of worsening headaches over the past 2 days, constant, moderate, light noise sensitivity, nauseous, she has to take today off from work, she has tried multiple dose of Cambia and Percocet without help  CAT scan in June showed evidence of kidney stone, Topamax is no longer a good choice, she also complains of mental cloudiness was Topamax, require a depressed, I have discussed treatment options, decided to switch her to lamotrigine, which can help her depression  UPDATE Feb 19 2015: She has tried and failed different triptan seen in the past, currently taking cambia as needed, sometimes require half to one tablets of Percocet with cambia She has been followed up with her psychiatrist Salome Holmes, has a follow-up visit next week, currently taking Latuda 80 mg daily, clonazepam every night for depression anxiety  We have tried different triptan treatment in the past including Imitrex, Maxalt, Relpax, high co-pay for Frova,  REVIEW OF SYSTEMS: Full 14 system review of systems performed and notable only for those listed, all others are neg: Light sensitivity, ear pain, nausea, insomnia, frequent awakening, daytime sleepiness, snoring, joint pain, joint swelling, bruises easily, headache speech difficulty depression anxiety   ALLERGIES: No Known Allergies  HOME MEDICATIONS: Outpatient Prescriptions Prior to Visit  Medication Sig Dispense Refill  . cetirizine (ZYRTEC) 10 MG chewable tablet Chew 10 mg by mouth daily.    . clonazePAM (KLONOPIN) 2 MG tablet Take 2 mg by mouth 2 (two) times daily.    . Cyanocobalamin (VITAMIN B-12 CR PO) Take 1 tablet by mouth at bedtime.     Marland Kitchen LATUDA 80 MG TABS tablet Take 80 mg by mouth daily with breakfast.      . levothyroxine (SYNTHROID, LEVOTHROID) 88 MCG tablet     . ondansetron (ZOFRAN) 4 MG tablet Take 1 tablet (4 mg total) by mouth every 6 (six) hours. 12 tablet 0  . oxyCODONE-acetaminophen (PERCOCET) 7.5-325 MG per tablet Take 1 tablet by mouth every 8 (eight) hours as needed for moderate pain. 30 tablet 0  . cyclobenzaprine (FLEXERIL) 10 MG tablet Take 10 mg by mouth as needed for muscle spasms.    . Diclofenac Potassium (CAMBIA) 50 MG PACK Take 50 mg by mouth as needed. 9 each 6  . lamoTRIgine (LAMICTAL) 25 MG tablet 1 tablet twice a day for 1 week, then 2 tablets twice a day, then 3 tablets twice a day 180 tablet 3   No facility-administered medications prior to visit.    PAST MEDICAL HISTORY: Past Medical History  Diagnosis Date  . Anemia   . Asthma   . OCD (obsessive compulsive disorder)   . Eczema   . Migraine   . Anxiety   . Depression   . Hashimoto's disease     PAST SURGICAL HISTORY: Past Surgical History  Procedure Laterality Date  . Rhinoplasty    . Septoplasty    . Cesarean section      FAMILY HISTORY: No family history on file.  SOCIAL  HISTORY: Social History   Social History  . Marital Status: Married    Spouse Name: [name]  . Number of Children: 1  . Years of Education: college   Occupational History  .  [employer]   Social History Main Topics  . Smoking status: Current Every Day Smoker -- 0.25 packs/day    Types: Cigarettes  . Smokeless tobacco: Never Used  . Alcohol Use: Yes     Comment: occas.  . Drug Use: No     Comment: quit over 20 years ago  . Sexual Activity: Yes    Birth Control/ Protection: Condom   Other Topics Concern  . Not on file   Social History Narrative   Patient is married [name]) and has one child.   Patient works from home for [employer].   Patient has a college education.   Patient drinks one cup of caffeine daily.           PHYSICAL EXAM  Filed Vitals:   02/19/15 1640  BP: 112/62  Pulse: 78    Height:  (1.676 m)  Weight: 132 lb (59.875 kg)   Body mass index is 21.32 kg/(m^2).   PHYSICAL EXAMNIATION:  Gen: NAD, conversant, well nourised, obese, well groomed                     Cardiovascular: Regular rate rhythm, no peripheral edema, warm, nontender. Eyes: Conjunctivae clear without exudates or hemorrhage Neck: Supple, no carotid bruise. Pulmonary: Clear to auscultation bilaterally   NEUROLOGICAL EXAM:  MENTAL STATUS: Speech:    Speech is normal; fluent and spontaneous with normal comprehension.  Cognition: Depressed looking middle-age female    The patient is oriented to person, place, and time;     recent and remote memory intact;     language fluent;     normal attention, concentration,     fund of knowledge.  CRANIAL NERVES: CN II: Visual fields are full to confrontation. Fundoscopic exam is normal with sharp discs and no vascular changes. Pupil equal round reactive to light. CN III, IV, VI: extraocular movement are normal. No ptosis. CN V: Facial sensation is intact to pinprick in all 3 divisions bilaterally. Corneal responses are intact.  CN VII: Face is symmetric with normal eye closure and smile. CN VIII: Hearing is normal to rubbing fingers CN IX, X: Palate elevates symmetrically. Phonation is normal. CN XI: Head turning and shoulder shrug are intact CN XII: Tongue is midline with normal movements and no atrophy.  MOTOR: There is no pronator drift of out-stretched arms. Muscle bulk and tone are normal. Muscle strength is normal.  REFLEXES: Reflexes are 2+ and symmetric at the biceps, triceps, knees, and ankles. Plantar responses are flexor.  SENSORY: Light touch, pinprick, position sense, and vibration sense are intact in fingers and toes.  COORDINATION: Rapid alternating movements and fine finger movements are intact. There is no dysmetria on finger-to-nose and heel-knee-shin.   GAIT/STANCE: Posture is normal. Gait is steady with normal  steps, base, arm swing, and turning. Heel and toe walking are normal. Tandem gait is normal.  Romberg is absent.  DIAGNOSTIC DATA (LABS, IMAGING, TESTING) -None to review  ASSESSMENT AND PLAN  Chronic migraine:   Previously she has tried and failed Topamax, mental fogginess, nortriptyline, no help, verapamil, beta blocker, she could not tolerate lamotrigine  We have tried different triptan treatment in the past including Imitrex, Maxalt, Relpax, high co-pay for Frova,  She is currently taking cambia  as needed along with Percocet  Botox injection as migraine prevention  Return to clinic in 3-4 weeks after preauthorization is completed    Levert FeinsteinYijun Eleri Ruben, M.D. Ph.D.  [facility] [address] [place], [place] [address] Phone: [phone] Fax:      [phone]

## 2015-04-23 ENCOUNTER — Other Ambulatory Visit: Payer: Self-pay | Admitting: Neurology

## 2015-04-23 NOTE — Telephone Encounter (Signed)
Patient called to request refill of oxyCODONE-acetaminophen (PERCOCET) 7.5-325 MG tablet °

## 2015-04-24 ENCOUNTER — Telehealth: Payer: Self-pay | Admitting: *Deleted

## 2015-04-24 MED ORDER — OXYCODONE-ACETAMINOPHEN 7.5-325 MG PO TABS: 1.0000 | ORAL_TABLET | Freq: Three times a day (TID) | ORAL | Status: DC | PRN

## 2015-04-24 NOTE — Telephone Encounter (Signed)
I was out of the office yesterday.  Request forwarded to provider for review.

## 2015-04-24 NOTE — Telephone Encounter (Signed)
Rx for oxycodone placed up front for patient pick up.

## 2015-06-24 ENCOUNTER — Other Ambulatory Visit: Payer: Self-pay | Admitting: Neurology

## 2015-06-24 ENCOUNTER — Telehealth: Payer: Self-pay | Admitting: *Deleted

## 2015-06-24 MED ORDER — OXYCODONE-ACETAMINOPHEN 7.5-325 MG PO TABS: 1.0000 | ORAL_TABLET | Freq: Three times a day (TID) | ORAL | Status: DC | PRN

## 2015-06-24 NOTE — Telephone Encounter (Signed)
Oxycodone rx placed up front for pick up. 

## 2015-06-24 NOTE — Telephone Encounter (Signed)
Rx printed and on Michelle's desk waiting for signature

## 2015-06-24 NOTE — Telephone Encounter (Signed)
Pt called requesting refill for oxyCODONE-acetaminophen (PERCOCET) 7.5-325 MG tablet

## 2015-06-24 NOTE — Telephone Encounter (Signed)
Rx placed up front for pick up. 

## 2015-08-19 ENCOUNTER — Telehealth: Payer: Self-pay | Admitting: *Deleted

## 2015-08-19 ENCOUNTER — Other Ambulatory Visit: Payer: Self-pay | Admitting: *Deleted

## 2015-08-19 MED ORDER — OXYCODONE-ACETAMINOPHEN 7.5-325 MG PO TABS: 1.0000 | ORAL_TABLET | Freq: Three times a day (TID) | ORAL | Status: DC | PRN

## 2015-08-19 NOTE — Telephone Encounter (Signed)
Pt called, requesting a refill on oxyCODONE-acetaminophen (PERCOCET) 7.5-325 MG tablet. Please advise. [phone]

## 2015-08-19 NOTE — Telephone Encounter (Signed)
Patient is interested in the Botox injections offered at her last appt.  Duwayne HeckDanielle will check on the authorization/cost and contact patient.  Oxycodone rx placed up front for pick up.  Patient aware to expect call from our office to discuss cost of injections.

## 2015-08-20 NOTE — Telephone Encounter (Signed)
Spoke with patient and informed her i would call Rx in and get a better idea of her cost.

## 2015-09-30 ENCOUNTER — Encounter: Payer: Self-pay | Admitting: Neurology

## 2015-09-30 ENCOUNTER — Ambulatory Visit (INDEPENDENT_AMBULATORY_CARE_PROVIDER_SITE_OTHER): Payer: 59 | Admitting: Neurology

## 2015-09-30 VITALS — BP 114/76 | HR 90 | Ht 66.0 in | Wt 129.0 lb

## 2015-09-30 DIAGNOSIS — F341 Dysthymic disorder: Secondary | ICD-10-CM | POA: Diagnosis not present

## 2015-09-30 DIAGNOSIS — G43009 Migraine without aura, not intractable, without status migrainosus: Secondary | ICD-10-CM

## 2015-09-30 MED ORDER — TIZANIDINE HCL 4 MG PO TABS: 4.0000 mg | ORAL_TABLET | Freq: Four times a day (QID) | ORAL | Status: DC | PRN

## 2015-09-30 MED ORDER — KETOROLAC TROMETHAMINE 10 MG PO TABS: 10.0000 mg | ORAL_TABLET | Freq: Four times a day (QID) | ORAL | Status: DC | PRN

## 2015-09-30 MED ORDER — ZONISAMIDE 100 MG PO CAPS: 100.0000 mg | ORAL_CAPSULE | Freq: Two times a day (BID) | ORAL | Status: AC

## 2015-09-30 MED ORDER — OXYCODONE-ACETAMINOPHEN 7.5-325 MG PO TABS: 1.0000 | ORAL_TABLET | Freq: Three times a day (TID) | ORAL | Status: DC | PRN

## 2015-09-30 NOTE — Progress Notes (Signed)
Chief Complaint  Patient presents with  . Migraine    Reports having 3-4 days of migraines per week.  She is still using oxycodone and Cambia for treatment.  She would like to discuss starting Botox injections.     [facility]  PATIENT: Sheila Woods DOB: [birth year]   REASON FOR VISIT: Followup for migraine   HISTORY OF PRESENT ILLNESS:Sheila Woods, 40 year-old female returns for followup of her chronic migraine.  She has PMHx of OCD, iron deficiency anemia, depression presenting with chronic migraines.She had headaches since age 47, migraine described as severe stabbing pain, lateralized, radiating to her jaw, often proceed by visual changes, loss of peripheral vision.  sometimes, oculomigraine alone without significant headahces. Headache lasted 2 hours, with preventive Topamax, she only has 1-2 every month,  She is taking percocet 7.5/325 prn and sumavel prn. Tired imitrex injection, and other triptan in the past including imitrex po, maxalt, relpax, zomig, triximex,without help.  She has tried other preventive medications in the past including pamelor, effexor, elavil, nortriptyline, depakote without help either.  07/16/11: Pt reports migraines are better, she has been taking an over the counter preparation caled Limu. She claims she is taking her Topamax as well even though Sheila Woods received a letter from [employer] stating her compliance with meds was less than 70%.She did not do Botox because of her co pay. She has seen ENT Sheila Woods who wanted her to have CT to see if polyps had returned but she did not want to go through another surgery. She has been placed on abilify by psych. She recently got married. No new neurologic complaints.   04/22/12: Returns for followup. Has been off her Topamax for several months because she ran out. She has had a daily headache since that time. She has stopped her Propranolol. She continues to see psych for her anxiety,  depression. She works out of the home.     04/2013: She has a history of migraines and is currently on Topamax 150 daily. She has Sumavel ordered acutely, but due to cost has been taking Percocet. She was recently diagnosed as hypothyroid. She continues to have adjustments in her medication. Headaches are in fairly good control. She returns for reevaluation  UPDATE March 22nd 2016: She still has headache 3 times a week, retrorbital area, occipital region, pounding, light noise sensitive, nauseous,  She has to take off from work, up to 2 days a week, she is on Topamax 150 mg daily, denies significant side effect, she used about 30 tablets of Percocet in one month, previously tried different triptan's, including Imitrex, Maxalt, Zomig, Relpax, without helping, currently taking Imitrex injection as needed, not sure if this truly helping her, Percocet helps her temporarily, she is continue combating with significant depression, is taking Latuda and Clonazepam  UPDATE October 04 2014: She still has frequent headaches, up to 4 severe migraine each week, she used to take Percocet as needed,   Higher dose of Topamax caused slurred speech, she is currently taking 100 mg daily, also tried Media planner since last visit in March which did not help her, Verlin Fester helps her only partially.  She works for [employer], require computer work, and talking on the phone, since April 2016, she noticed slurred speech,  Laboratory evaluation in June 2016 showed normal CMP, CBC, with exception of mild elevated glucose 130, WBC of 13 point 9  She also suffered severe depression, is under the care of Sheila Woods, she  is taking Latuda 80 mg daily, clonazepam 1 mg twice a day  I reviewed her emergency room notes in June first 2016 for left kidney stone,  CT abdomen in June 2016 showed: Partially obstructing tiny distal left ureteral calculus with associated hydronephrosis and delayed contrast excretion. No  other urinary tract calculi identified.  Small uterine fibroids.  UPDATE August 9th 2016: We have reviewed MRI of the brain that was normal, she slides down steps today, landed on her buttock, with bruising of her arms  She complains of worsening headaches over the past 2 days, constant, moderate, light noise sensitivity, nauseous, she has to take today off from work, she has tried multiple dose of Cambia and Percocet without help  CAT scan in June showed evidence of kidney stone, Topamax is no longer a good choice, she also complains of mental cloudiness was Topamax, require a depressed, I have discussed treatment options, decided to switch her to lamotrigine, which can help her depression  UPDATE Feb 19 2015: She has tried and failed different triptan seen in the past, currently taking cambia as needed, sometimes require half to one tablets of Percocet with cambia She has been followed up with her psychiatrist Sheila Woods, has a follow-up visit next week, currently taking Latuda 80 mg daily, clonazepam every night for depression anxiety  We have tried different triptan treatment in the past including Imitrex, Maxalt, Relpax, high co-pay for Frova,  UPDATE June 26th 2017: She continue complaining 3-4 headaches each week, severe, pounding with light noise sensitivity, lasting all day, she is now taking Percocet 7.5/325 mg half tablets along with diclofenac 50 mg tablets as needed, which helps her headaches,  She continued to fighting her depression anxiety  REVIEW OF SYSTEMS: Full 14 system review of systems performed and notable only for those listed, all others are neg: Depression anxiety, headaches, bruise easily, ear pain   ALLERGIES: No Known Allergies  HOME MEDICATIONS: Outpatient Prescriptions Prior to Visit  Medication Sig Dispense Refill  . cetirizine (ZYRTEC) 10 MG chewable tablet Chew 10 mg by mouth daily.    . clonazePAM (KLONOPIN) 2 MG tablet Take 2 mg by mouth 2 (two)  times daily.    . Cyanocobalamin (VITAMIN B-12 CR PO) Take 1 tablet by mouth at bedtime.     Marland Kitchen. DICLOFENAC POTASSIUM PO Take by mouth as needed.    Marland Kitchen. LATUDA 80 MG TABS tablet Take 80 mg by mouth daily with breakfast.     . levothyroxine (SYNTHROID, LEVOTHROID) 88 MCG tablet     . ondansetron (ZOFRAN) 4 MG tablet Take 1 tablet (4 mg total) by mouth every 6 (six) hours. 12 tablet 0  . oxyCODONE-acetaminophen (PERCOCET) 7.5-325 MG tablet Take 1 tablet by mouth every 8 (eight) hours as needed for moderate pain. 30 tablet 0  . propranolol (INDERAL) 10 MG tablet daily. For anxiety     No facility-administered medications prior to visit.    PAST MEDICAL HISTORY: Past Medical History  Diagnosis Date  . Anemia   . Asthma   . OCD (obsessive compulsive disorder)   . Eczema   . Migraine   . Anxiety   . Depression   . Hashimoto's disease     PAST SURGICAL HISTORY: Past Surgical History  Procedure Laterality Date  . Rhinoplasty    . Septoplasty    . Cesarean section      FAMILY HISTORY: No family history on file.  SOCIAL HISTORY: Social History   Social History  . Marital  Status: Married    Spouse Name: Weston Brassick  . Number of Children: 1  . Years of Education: college   Occupational History  .  [employer]   Social History Main Topics  . Smoking status: Current Every Day Smoker -- 0.25 packs/day    Types: Cigarettes  . Smokeless tobacco: Never Used  . Alcohol Use: Yes     Comment: occas.  . Drug Use: No     Comment: quit over 20 years ago  . Sexual Activity: Yes    Birth Control/ Protection: Condom   Other Topics Concern  . Not on file   Social History Narrative   Patient is married [name](Nick) and has one child.   Patient works from home for [employer].   Patient has a college education.   Patient drinks one cup of caffeine daily.           PHYSICAL EXAM  Filed Vitals:   09/30/15 1635  Height: 5\' 6"  (1.676 m)  Weight: 129 lb (58.514 kg)   Body mass  index is 20.83 kg/(m^2).   PHYSICAL EXAMNIATION:  Gen: NAD, conversant, well nourised, obese, well groomed                     Cardiovascular: Regular rate rhythm, no peripheral edema, warm, nontender. Eyes: Conjunctivae clear without exudates or hemorrhage Neck: Supple, no carotid bruise. Pulmonary: Clear to auscultation bilaterally   NEUROLOGICAL EXAM:  MENTAL STATUS: Speech:    Speech is normal; fluent and spontaneous with normal comprehension.  Cognition: Depressed looking middle-age female    The patient is oriented to person, place, and time;     recent and remote memory intact;     language fluent;     normal attention, concentration,     fund of knowledge.  CRANIAL NERVES: CN II: Visual fields are full to confrontation. Fundoscopic exam is normal with sharp discs and no vascular changes. Pupil equal round reactive to light. CN III, IV, VI: extraocular movement are normal. No ptosis. CN V: Facial sensation is intact to pinprick in all 3 divisions bilaterally. Corneal responses are intact.  CN VII: Face is symmetric with normal eye closure and smile. CN VIII: Hearing is normal to rubbing fingers CN IX, X: Palate elevates symmetrically. Phonation is normal. CN XI: Head turning and shoulder shrug are intact CN XII: Tongue is midline with normal movements and no atrophy.  MOTOR: There is no pronator drift of out-stretched arms. Muscle bulk and tone are normal. Muscle strength is normal.  REFLEXES: Reflexes are 2+ and symmetric at the biceps, triceps, knees, and ankles. Plantar responses are flexor.  SENSORY: Light touch, pinprick, position sense, and vibration sense are intact in fingers and toes.  COORDINATION: Rapid alternating movements and fine finger movements are intact. There is no dysmetria on finger-to-nose and heel-knee-shin.   GAIT/STANCE: Posture is normal. Gait is steady with normal steps, base, arm swing, and turning. Heel and toe walking are normal.  Tandem gait is normal.  Romberg is absent.  DIAGNOSTIC DATA (LABS, IMAGING, TESTING) -None to review  ASSESSMENT AND PLAN  Chronic migraine:   Previously she has tried and failed Topamax, mental fogginess, nortriptyline, no help, verapamil, beta blocker, she could not tolerate lamotrigine,  Depakote did not help  Added on Zonegran 100 mg twice a day  We have tried different triptan treatment in the past including Imitrex, Maxalt, Relpax, high co-pay for Frova,  For abortive treatment, tizanidine 4 mg, Toradol 10  mg, as needed  She is currently taking cambia as needed along with Percocet 30 tablets every 2 months  Botox injection as migraine prevention was discussed, patient decided to hold off injection at this point  Return to clinic in3 months   Levert Feinstein, M.D. Ph.D.  [facility] [address] [place], [place] [address] Phone: [phone] Fax:      [phone]

## 2015-09-30 NOTE — Telephone Encounter (Signed)
Spoke with patient in office today who stated she would call back if she decided to scheduled.

## 2015-11-13 ENCOUNTER — Other Ambulatory Visit: Payer: Self-pay | Admitting: Neurology

## 2015-12-18 ENCOUNTER — Telehealth: Payer: Self-pay | Admitting: Neurology

## 2015-12-18 MED ORDER — OXYCODONE-ACETAMINOPHEN 7.5-325 MG PO TABS: 1.0000 | ORAL_TABLET | Freq: Three times a day (TID) | ORAL | 0 refills | Status: DC | PRN

## 2015-12-18 NOTE — Telephone Encounter (Signed)
Per last office note, she is allowed oxycodone #30 every 2 months.  Her last rx was postdated to fill on 10/19/15.  She has a pending appointment scheduled.  Rx printed, signed and placed up front for pick up.

## 2015-12-18 NOTE — Telephone Encounter (Signed)
Pt request refill for oxyCODONE-acetaminophen (PERCOCET) 7.5-325 MG tablet . Next appt is 10/12 with Eber Jonesarolyn

## 2016-01-16 ENCOUNTER — Ambulatory Visit: Payer: 59 | Admitting: Nurse Practitioner

## 2016-01-23 ENCOUNTER — Ambulatory Visit: Payer: Self-pay | Admitting: Nurse Practitioner

## 2016-01-23 ENCOUNTER — Ambulatory Visit (INDEPENDENT_AMBULATORY_CARE_PROVIDER_SITE_OTHER): Payer: 59 | Admitting: Nurse Practitioner

## 2016-01-23 ENCOUNTER — Encounter: Payer: Self-pay | Admitting: Nurse Practitioner

## 2016-01-23 VITALS — BP 114/84 | HR 104 | Ht 66.0 in | Wt 141.0 lb

## 2016-01-23 DIAGNOSIS — G43909 Migraine, unspecified, not intractable, without status migrainosus: Secondary | ICD-10-CM | POA: Diagnosis not present

## 2016-01-23 MED ORDER — TIZANIDINE HCL 4 MG PO TABS: ORAL_TABLET | ORAL | 3 refills | Status: DC

## 2016-01-23 MED ORDER — KETOROLAC TROMETHAMINE 10 MG PO TABS: 10.0000 mg | ORAL_TABLET | Freq: Four times a day (QID) | ORAL | 1 refills | Status: DC | PRN

## 2016-01-23 NOTE — Progress Notes (Signed)
I reviewed above note and agree with the assessment and plan.  Marvel PlanJindong Shelvia Fojtik, MD PhD Stroke Neurology 01/23/2016 6:38 PM

## 2016-01-23 NOTE — Patient Instructions (Addendum)
Continue Zonegran 100 mg twice daily Continue tizanidine 4 mg every 6 hours as needed will refill abortive treatment Continue Toradol 10 mg as needed will refill abortive treatment Patient also has Percocet 30 tablets to take every 2 months Once again discussed Botox but she has not met an insurance deductible Return to clinic in 4 months

## 2016-01-23 NOTE — Progress Notes (Signed)
[facility]  PATIENT: Sheila Woods DOB: [birth year]   REASON FOR VISIT: Follow-up for migraine  HISTORY FROM: Patient    HISTORY OF PRESENT ILLNESS: Sheila Woods, 40 year-old female returns for followup of her chronic migraine.  She has PMHx of OCD, iron deficiency anemia, depression presenting with chronic migraines.She had headaches since age 57, migraine described as severe stabbing pain, lateralized, radiating to her jaw, often proceed by visual changes, loss of peripheral vision.  sometimes, oculomigraine alone without significant headahces. Headache lasted 2 hours, with preventive Topamax, she only has 1-2 every month,  She is taking percocet 7.5/325 prn and sumavel prn. Tired imitrex injection, and other triptan in the past including imitrex po, maxalt, relpax, zomig, triximex,without help.  She has tried other preventive medications in the past including pamelor, effexor, elavil, nortriptyline, depakote without help either.  07/16/11: Pt reports migraines are better, she has been taking an over the counter preparation caled Limu. She claims she is taking her Topamax as well even though Dr. Krista Blue received a letter from [employer] stating her compliance with meds was less than 70%.She did not do Botox because of her co pay. She has seen ENT Dr. Janace Hoard who wanted her to have CT to see if polyps had returned but she did not want to go through another surgery. She has been placed on abilify by psych. She recently got married. No new neurologic complaints.  UPDATE March 22nd 2016:YY She still has headache 3 times a week, retrorbital area, occipital region, pounding, light noise sensitive, nauseous,  She has to take off from work, up to 2 days a week, she is on Topamax 150 mg daily, denies significant side effect, she used about 30 tablets of Percocet in one month, previously tried different triptan's, including Imitrex, Maxalt, Zomig, Relpax, without helping,  currently taking Imitrex injection as needed, not sure if this truly helping her, Percocet helps her temporarily, she is continue combating with significant depression, is taking Latuda and Clonazepam  UPDATE October 04 2014:YYShe still has frequent headaches, up to 4 severe migraine each week, she used to take Percocet as needed,  Higher dose of Topamax caused slurred speech, she is currently taking 100 mg daily, also tried Pamelor since last visit in March which did not help her, Cathren Harsh helps her only partially. She works for [employer], require computer work, and talking on the phone, since April 2016, she noticed slurred speech, Laboratory evaluation in June 2016 showed normal CMP, CBC, with exception of mild elevated glucose 130, WBC of 13 point 9 She also suffered severe depression, is under the care of Dr. Charolette Child, she is taking Latuda 80 mg daily, clonazepam 1 mg twice a day I reviewed her emergency room notes in June first 2016 for left kidney stone, CT abdomen in June 2016 showed: Partially obstructing tiny distal left ureteral calculus with associated hydronephrosis and delayed contrast excretion. No other urinary tract calculi identified.  Small uterine fibroids. UPDATE August 9th 2016:YY We have reviewed MRI of the brain that was normal, she slides down steps today, landed on her buttock, with bruising of her arms  She complains of worsening headaches over the past 2 days, constant, moderate, light noise sensitivity, nauseous, she has to take today off from work, she has tried multiple dose of Cambia and Percocet without help  CAT scan in June showed evidence of kidney stone, Topamax is no longer a good choice, she also complains of mental cloudiness was  Topamax, require a depressed, I have discussed treatment options, decided to switch her to lamotrigine, which can help her depression  UPDATE Feb 19 2015:YY She has tried and failed different triptan seen  in the past, currently taking cambia as needed, sometimes require half to one tablets of Percocet with cambia She has been followed up with her psychiatrist Eulogio Ditch, has a follow-up visit next week, currently taking Latuda 80 mg daily, clonazepam every night for depression anxiety  We have tried different triptan treatment in the past including Imitrex, Maxalt, Relpax, high co-pay for Frova,  UPDATE June 26th 2017:YY She continue complaining 3-4 headaches each week, severe, pounding with light noise sensitivity, lasting all day, she is now taking Percocet 7.5/325 mg half tablets along with diclofenac 50 mg tablets as needed, which helps her headaches, She continued to fighting her depression anxiety  UPDATE 02/19/2017CM Sheila Woods, 40 year old female returns for follow-up. She has a history of migraine headaches and was placed on Zonegran at her last visit. She claims her headaches are doing okay. She would like to get Botox but needs to meet her insurance deductible first she has tried multiple preventives in the past to includeTopamax, mental fogginess, nortriptyline, no help, verapamil, beta blocker, she could not tolerate lamotrigine,Depakote did not help she has also tried triptan's Imitrex, Maxalt, Relpax, high co-pay for Frova, without benefit. She returns for reevaluation   REVIEW OF SYSTEMS: Full 14 system review of systems performed and notable only for those listed, all others are neg:  Constitutional: Fatigue  Cardiovascular: neg Ear/Nose/Throat: neg  Skin: neg Eyes: neg Respiratory: neg Gastroitestinal: neg  Hematology/Lymphatic: neg  Endocrine: neg Musculoskeletal:neg Allergy/Immunology: neg Neurological: Headache Psychiatric: Depression and anxiety Sleep : neg   ALLERGIES: No Known Allergies  HOME MEDICATIONS: Outpatient Medications Prior to Visit  Medication Sig Dispense Refill  . BELSOMRA 15 MG TABS at bedtime.    . cetirizine (ZYRTEC) 10 MG chewable tablet  Chew 10 mg by mouth daily.    . clonazePAM (KLONOPIN) 2 MG tablet Take 2 mg by mouth 2 (two) times daily.    . Cyanocobalamin (VITAMIN B-12 CR PO) Take 1 tablet by mouth at bedtime.     Marland Kitchen DICLOFENAC POTASSIUM PO Take by mouth as needed.    Marland Kitchen ketorolac (TORADOL) 10 MG tablet TAKE 1 TABLET BY MOUTH EVERY 6 HOURS AS NEEDED. 20 tablet 0  . LATUDA 80 MG TABS tablet Take 80 mg by mouth daily with breakfast.     . ondansetron (ZOFRAN) 4 MG tablet Take 1 tablet (4 mg total) by mouth every 6 (six) hours. (Patient taking differently: Take 4 mg by mouth every 6 (six) hours as needed. ) 12 tablet 0  . oxyCODONE-acetaminophen (PERCOCET) 7.5-325 MG tablet Take 1 tablet by mouth every 8 (eight) hours as needed for moderate pain. 30 tablet 0  . tiZANidine (ZANAFLEX) 4 MG tablet TAKE 1 TABLET BY MOUTH EVERY 6 HOURS AS NEEDED FOR MUSCLE SPASMS. 30 tablet 0  . zonisamide (ZONEGRAN) 100 MG capsule Take 1 capsule (100 mg total) by mouth 2 (two) times daily. 60 capsule 11  . levothyroxine (SYNTHROID, LEVOTHROID) 88 MCG tablet      No facility-administered medications prior to visit.     PAST MEDICAL HISTORY: Past Medical History:  Diagnosis Date  . Anemia   . Anxiety   . Asthma   . Depression   . Eczema   . Hashimoto's disease   . Migraine   . OCD (obsessive compulsive disorder)  PAST SURGICAL HISTORY: Past Surgical History:  Procedure Laterality Date  . CESAREAN SECTION    . RHINOPLASTY    . SEPTOPLASTY      FAMILY HISTORY: History reviewed. No pertinent family history.  SOCIAL HISTORY: Social History   Social History  . Marital status: Married    Spouse name: Merrilee Seashore  . Number of children: 1  . Years of education: college   Occupational History  .  [employer]   Social History Main Topics  . Smoking status: Current Every Day Smoker    Packs/day: 0.25    Types: Cigarettes  . Smokeless tobacco: Never Used  . Alcohol use Yes     Comment: occas.  . Drug use: No     Comment:  quit over 20 years ago  . Sexual activity: Yes    Birth control/ protection: Condom   Other Topics Concern  . Not on file   Social History Narrative   Patient is married Merrilee Seashore) and has one child.   Patient works from home for [employer].   Patient has a college education.   Patient drinks one cup of caffeine daily.           PHYSICAL EXAM  Vitals:   01/23/16 1542  BP: 114/84  Pulse: (!) 104  Weight: 141 lb (64 kg)  Height: '5\' 6"'$  (1.676 m)   Body mass index is 22.76 kg/m. Gen: NAD, conversant, well nourised,  well groomed                     Cardiovascular: Regular rate rhythm, Neck: Supple, no carotid bruit. NEUROLOGICAL EXAM:  MENTAL STATUS: Speech:Speech is normal; fluent and spontaneous with normal comprehension.  Cognition: Depressed looking middle-age female The patient is oriented to person, place, and time;   recent and remote memory intact; language fluent; normal attention, concentration, fund of knowledge. CRANIAL NERVES: CN II: Visual fields are full to confrontation. Fundoscopic exam is normal with sharp discs and no vascular changes. Pupil equal round reactive to light. CN III, IV, VI: extraocular movement are normal. No ptosis. CN V: Facial sensation is intact to pinprick in all 3 divisions bilaterally. .  CN VII: Face is symmetric with normal eye closure and smile. CN VIII: Hearing is normal to rubbing fingers CN IX, X: Palate elevates symmetrically. Phonation is normal. CN XI: Head turning and shoulder shrug are intact CN XII: Tongue is midline with normal movements and no atrophy.  MOTOR:There is no pronator drift of out-stretched arms. Muscle bulk and tone are normal. Muscle strength is normal. REFLEXES:Reflexes are 2+ and symmetric at the biceps, triceps, knees, and ankles. Plantar responses are flexor. SENSORY:Light touch, pinprick, position sense, and vibration sense are intact in fingers and toes. COORDINATION:Rapid alternating  movements and fine finger movements are intact. There is no dysmetria on finger-to-nose and heel-knee-shin.  GAIT/STANCE:Posture is normal. Gait is steady with normal steps, base, arm swing, and turning. Heel and toe walking are normal. Tandem gait is normal.  Romberg is absent.   DIAGNOSTIC DATA (LABS, IMAGING, TESTING) - I reviewed patient records, labs, notes, testing and imaging myself where available.  Lab Results  Component Value Date   WBC 13.5 (H) 09/05/2014   HGB 12.8 09/05/2014   HCT 40.2 09/05/2014   MCV 92.8 09/05/2014   PLT 225 09/05/2014      Component Value Date/Time   NA 138 09/05/2014 1055   K 4.0 09/05/2014 1055   CL 109 09/05/2014 1055  CO2 22 09/05/2014 1055   GLUCOSE 129 (H) 09/05/2014 1055   BUN 14 09/05/2014 1055   CREATININE 1.00 09/05/2014 1055   CALCIUM 8.9 09/05/2014 1055   PROT 7.2 09/05/2014 1055   ALBUMIN 4.3 09/05/2014 1055   AST 17 09/05/2014 1055   ALT 10 (L) 09/05/2014 1055   ALKPHOS 68 09/05/2014 1055   BILITOT 0.4 09/05/2014 1055   GFRNONAA >60 09/05/2014 1055   GFRAA >60 09/05/2014 1055    ASSESSMENT AND PLAN  40 y.o. year old female  has a past medical history of Anemia; Anxiety;  Depression; Migraine; and OCD (obsessive compulsive disorder). here To follow-up. She has failed multiple preventives in the past to includeTopamax, mental fogginess, nortriptyline, no help, verapamil, beta blocker, she could not tolerate lamotrigine,Depakote did not help. She has also tried triptan's Imitrex, Maxalt, Relpax, high co-pay for Frova, without benefit. The patient is a current patient of Dr. Krista Blue  who is out of the office today . This note is sent to the work in doctor.     PLAN:Continue Zonegran 100 mg twice daily Continue tizanidine 4 mg every 6 hours as needed will refill abortive treatment Continue Toradol 10 mg as needed will refill abortive treatment Patient also has Percocet 30 tablets to take every 2 months Once again discussed Botox but  she has not met an insurance deductible Return to clinic in 4 months Dennie Bible, [facility], [facility], [facility] [address], [place], [place] [address] [phone]

## 2016-02-17 ENCOUNTER — Other Ambulatory Visit: Payer: Self-pay | Admitting: *Deleted

## 2016-02-17 ENCOUNTER — Telehealth: Payer: Self-pay | Admitting: Neurology

## 2016-02-17 MED ORDER — OXYCODONE-ACETAMINOPHEN 7.5-325 MG PO TABS: 1.0000 | ORAL_TABLET | Freq: Three times a day (TID) | ORAL | 0 refills | Status: DC | PRN

## 2016-02-17 NOTE — Telephone Encounter (Signed)
Refill due - printed and waiting for Dr. Zannie CoveYan's approval/signature.

## 2016-02-17 NOTE — Telephone Encounter (Signed)
Rx signed and placed up front. 

## 2016-02-17 NOTE — Telephone Encounter (Signed)
Patient requesting refill of oxyCODONE-acetaminophen (PERCOCET) 7.5-325 MG tablet °Pharmacy: pick up ° °

## 2016-04-20 ENCOUNTER — Telehealth: Payer: Self-pay | Admitting: Neurology

## 2016-04-20 ENCOUNTER — Other Ambulatory Visit: Payer: Self-pay | Admitting: *Deleted

## 2016-04-20 MED ORDER — OXYCODONE-ACETAMINOPHEN 7.5-325 MG PO TABS: 1.0000 | ORAL_TABLET | Freq: Three times a day (TID) | ORAL | 0 refills | Status: DC | PRN

## 2016-04-20 NOTE — Telephone Encounter (Signed)
Patient requesting refill of oxyCODONE-acetaminophen (PERCOCET) 7.5-325 MG tablet. ° ° °

## 2016-04-20 NOTE — Telephone Encounter (Signed)
Narcotic registry reviewed - refill appropriate.  Printed, signed and placed up front for pick up.

## 2016-05-28 ENCOUNTER — Ambulatory Visit: Payer: 59 | Admitting: Nurse Practitioner

## 2016-06-15 ENCOUNTER — Telehealth: Payer: Self-pay | Admitting: Nurse Practitioner

## 2016-06-15 MED ORDER — OXYCODONE-ACETAMINOPHEN 7.5-325 MG PO TABS: 1.0000 | ORAL_TABLET | Freq: Three times a day (TID) | ORAL | 0 refills | Status: DC | PRN

## 2016-06-15 NOTE — Telephone Encounter (Signed)
[place] narcotic registry checked without any issues noted.  Rx post-dated, printed, signed and placed up front for pick up.

## 2016-06-15 NOTE — Addendum Note (Signed)
Addended by: Lindell SparKIRKMAN, MICHELLE C on: 06/15/2016 09:15 AM   Modules accepted: Orders

## 2016-06-15 NOTE — Telephone Encounter (Signed)
Patient requesting refill for oxyCODONE-acetaminophen (PERCOCET) 7.5-325 MG tablet   

## 2016-06-25 ENCOUNTER — Ambulatory Visit: Payer: 59 | Admitting: Nurse Practitioner

## 2016-07-15 ENCOUNTER — Encounter (INDEPENDENT_AMBULATORY_CARE_PROVIDER_SITE_OTHER): Payer: Self-pay

## 2016-07-15 ENCOUNTER — Ambulatory Visit (INDEPENDENT_AMBULATORY_CARE_PROVIDER_SITE_OTHER): Payer: 59 | Admitting: Nurse Practitioner

## 2016-07-15 ENCOUNTER — Encounter: Payer: Self-pay | Admitting: Nurse Practitioner

## 2016-07-15 VITALS — BP 111/77 | HR 109 | Wt 140.8 lb

## 2016-07-15 DIAGNOSIS — G43009 Migraine without aura, not intractable, without status migrainosus: Secondary | ICD-10-CM | POA: Diagnosis not present

## 2016-07-15 DIAGNOSIS — G43909 Migraine, unspecified, not intractable, without status migrainosus: Secondary | ICD-10-CM

## 2016-07-15 MED ORDER — TIZANIDINE HCL 4 MG PO TABS: ORAL_TABLET | ORAL | 3 refills | Status: AC

## 2016-07-15 MED ORDER — KETOROLAC TROMETHAMINE 10 MG PO TABS: 10.0000 mg | ORAL_TABLET | Freq: Four times a day (QID) | ORAL | 1 refills | Status: AC | PRN

## 2016-07-15 NOTE — Patient Instructions (Signed)
Continue Zonegran 100 mg twice daily Continue tizanidine 4 mg every 6 hours as needed will refill abortive treatment Continue Toradol 10 mg as needed will refill abortive treatment Patient also has Percocet 30 tablets to take every 2 months Once again discussed Botox but she has not met an insurance deductible Return to clinic in 4 months next with Dr. Krista Blue

## 2016-07-15 NOTE — Progress Notes (Signed)
[facility]  PATIENT: Sheila Woods DOB: [birth year]   REASON FOR VISIT: Follow-up for migraine  HISTORY FROM: Patient    HISTORY OF PRESENT ILLNESS: Sheila Woods, 41 year-old female returns for followup of her chronic migraine.  She has PMHx of OCD, iron deficiency anemia, depression presenting with chronic migraines.She had headaches since age 57, migraine described as severe stabbing pain, lateralized, radiating to her jaw, often proceed by visual changes, loss of peripheral vision.  sometimes, oculomigraine alone without significant headahces. Headache lasted 2 hours, with preventive Topamax, she only has 1-2 every month,  She is taking percocet 7.5/325 prn and sumavel prn. Tired imitrex injection, and other triptan in the past including imitrex po, maxalt, relpax, zomig, triximex,without help.  She has tried other preventive medications in the past including pamelor, effexor, elavil, nortriptyline, depakote without help either.  07/16/11: Pt reports migraines are better, she has been taking an over the counter preparation caled Limu. She claims she is taking her Topamax as well even though Dr. Krista Blue received a letter from [employer] stating her compliance with meds was less than 70%.She did not do Botox because of her co pay. She has seen ENT Dr. Janace Hoard who wanted her to have CT to see if polyps had returned but she did not want to go through another surgery. She has been placed on abilify by psych. She recently got married. No new neurologic complaints.  UPDATE March 22nd 2016:YY She still has headache 3 times a week, retrorbital area, occipital region, pounding, light noise sensitive, nauseous,  She has to take off from work, up to 2 days a week, she is on Topamax 150 mg daily, denies significant side effect, she used about 30 tablets of Percocet in one month, previously tried different triptan's, including Imitrex, Maxalt, Zomig, Relpax, without helping,  currently taking Imitrex injection as needed, not sure if this truly helping her, Percocet helps her temporarily, she is continue combating with significant depression, is taking Latuda and Clonazepam  UPDATE October 04 2014:YYShe still has frequent headaches, up to 4 severe migraine each week, she used to take Percocet as needed,  Higher dose of Topamax caused slurred speech, she is currently taking 100 mg daily, also tried Pamelor since last visit in March which did not help her, Cathren Harsh helps her only partially. She works for [employer], require computer work, and talking on the phone, since April 2016, she noticed slurred speech, Laboratory evaluation in June 2016 showed normal CMP, CBC, with exception of mild elevated glucose 130, WBC of 13 point 9 She also suffered severe depression, is under the care of Dr. Charolette Child, she is taking Latuda 80 mg daily, clonazepam 1 mg twice a day I reviewed her emergency room notes in June first 2016 for left kidney stone, CT abdomen in June 2016 showed: Partially obstructing tiny distal left ureteral calculus with associated hydronephrosis and delayed contrast excretion. No other urinary tract calculi identified.  Small uterine fibroids. UPDATE August 9th 2016:YY We have reviewed MRI of the brain that was normal, she slides down steps today, landed on her buttock, with bruising of her arms  She complains of worsening headaches over the past 2 days, constant, moderate, light noise sensitivity, nauseous, she has to take today off from work, she has tried multiple dose of Cambia and Percocet without help  CAT scan in June showed evidence of kidney stone, Topamax is no longer a good choice, she also complains of mental cloudiness was  Topamax, require a depressed, I have discussed treatment options, decided to switch her to lamotrigine, which can help her depression  UPDATE Feb 19 2015:YY She has tried and failed different triptan seen  in the past, currently taking cambia as needed, sometimes require half to one tablets of Percocet with cambia She has been followed up with her psychiatrist Eulogio Ditch, has a follow-up visit next week, currently taking Latuda 80 mg daily, clonazepam every night for depression anxiety  We have tried different triptan treatment in the past including Imitrex, Maxalt, Relpax, high co-pay for Frova,  UPDATE June 26th 2017:YY She continue complaining 3-4 headaches each week, severe, pounding with light noise sensitivity, lasting all day, she is now taking Percocet 7.5/325 mg half tablets along with diclofenac 50 mg tablets as needed, which helps her headaches, She continued to fighting her depression anxiety  UPDATE 04/11/18CM Sheila.Gruenwald, 41 year old female returns for follow-up. She has a history of migraine headaches and is now  on Zonegran as preventive. . She claims her headaches are doing okay. She is having 3 to 4 a week . She would like to get Botox but needs to meet her insurance deductible first she has tried multiple preventives in the past to includeTopamax, mental fogginess, nortriptyline, no help, verapamil, beta blocker, she could not tolerate lamotrigine,Depakote did not help she has also tried triptan's Imitrex, Maxalt, Relpax, high co-pay for Frova, without benefit. She is also on tizanidine 4 mg every 6 hours when necessary which is helpful and she has Zofran for nausea. Acutely she uses Toradol or Percocet. .She returns for reevaluation   REVIEW OF SYSTEMS: Full 14 system review of systems performed and notable only for those listed, all others are neg:  Constitutional: Fatigue  Cardiovascular: neg Ear/Nose/Throat: neg  Skin: neg Eyes: neg Respiratory: neg Gastroitestinal: neg  Hematology/Lymphatic: neg  Endocrine: neg Musculoskeletal:neg Allergy/Immunology: neg Neurological: Headache Psychiatric: Depression and anxiety Sleep : neg   ALLERGIES: No Known  Allergies  HOME MEDICATIONS: Outpatient Medications Prior to Visit  Medication Sig Dispense Refill  . cetirizine (ZYRTEC) 10 MG chewable tablet Chew 10 mg by mouth daily.    . clonazePAM (KLONOPIN) 2 MG tablet Take 2 mg by mouth 2 (two) times daily.    . Cyanocobalamin (VITAMIN B-12 CR PO) Take 1 tablet by mouth at bedtime.     Marland Kitchen DICLOFENAC POTASSIUM PO Take by mouth as needed.    Marland Kitchen ketorolac (TORADOL) 10 MG tablet Take 1 tablet (10 mg total) by mouth every 6 (six) hours as needed. 20 tablet 1  . LATUDA 80 MG TABS tablet Take 80 mg by mouth daily with breakfast.     . levothyroxine (SYNTHROID, LEVOTHROID) 100 MCG tablet Take 100 mcg by mouth daily before breakfast.    . ondansetron (ZOFRAN) 4 MG tablet Take 1 tablet (4 mg total) by mouth every 6 (six) hours. (Patient taking differently: Take 4 mg by mouth every 6 (six) hours as needed. ) 12 tablet 0  . oxyCODONE-acetaminophen (PERCOCET) 7.5-325 MG tablet Take 1 tablet by mouth every 8 (eight) hours as needed for moderate pain. 30 tablet 0  . tiZANidine (ZANAFLEX) 4 MG tablet TAKE 1 TABLET BY MOUTH EVERY 6 HOURS AS NEEDED FOR MUSCLE SPASMS. 60 tablet 3  . zonisamide (ZONEGRAN) 100 MG capsule Take 1 capsule (100 mg total) by mouth 2 (two) times daily. 60 capsule 11  . BELSOMRA 15 MG TABS at bedtime.     No facility-administered medications prior to visit.  PAST MEDICAL HISTORY: Past Medical History:  Diagnosis Date  . Anemia   . Anxiety   . Asthma   . Depression   . Eczema   . Hashimoto's disease   . Migraine   . OCD (obsessive compulsive disorder)     PAST SURGICAL HISTORY: Past Surgical History:  Procedure Laterality Date  . CESAREAN SECTION    . RHINOPLASTY    . SEPTOPLASTY      FAMILY HISTORY: History reviewed. No pertinent family history.  SOCIAL HISTORY: Social History   Social History  . Marital status: Married    Spouse name: Merrilee Seashore  . Number of children: 1  . Years of education: college   Occupational  History  .  [employer]   Social History Main Topics  . Smoking status: Former Smoker    Packs/day: 0.25    Types: Cigarettes    Quit date: 07/16/2014  . Smokeless tobacco: Never Used  . Alcohol use Yes     Comment: occas.  . Drug use: No     Comment: quit over 20 years ago  . Sexual activity: Yes    Birth control/ protection: Condom   Other Topics Concern  . Not on file   Social History Narrative   Patient is married Merrilee Seashore) and has one child.   Patient works from home for [employer].   Patient has a college education.   Patient drinks one cup of caffeine daily.           PHYSICAL EXAM  Vitals:   07/15/16 1526  BP: 111/77  Pulse: (!) 109  Weight: 140 lb 12.8 oz (63.9 kg)   Body mass index is 22.73 kg/m.  Generalized: Well developed, depressed looking female in no acute distress , well groomed Head: normocephalic and atraumatic,. Oropharynx benign  Neck: Supple,  Musculoskeletal: No deformity   Neurological examination   Mentation: Alert oriented to time, place, history taking. Attention span and concentration appropriate. Recent and remote memory intact.  Follows all commands speech and language fluent.   Cranial nerve II-XII: Fundoscopic exam deferred.Pupils were equal round reactive to light extraocular movements were full, visual field were full on confrontational test. Facial sensation and strength were normal. hearing was intact to finger rubbing bilaterally. Uvula tongue midline. head turning and shoulder shrug were normal and symmetric.Tongue protrusion into cheek strength was normal. Motor: normal bulk and tone, full strength in the BUE, BLE, fine finger movements normal, no pronator drift. No focal weakness Sensory: normal and symmetric to light touch, pinprick, and  Vibration, proprioception  Coordination: finger-nose-finger, heel-to-shin bilaterally, no dysmetria Reflexes: Brachioradialis 2/2, biceps 2/2, triceps 2/2, patellar 2/2, Achilles  2/2, plantar responses were flexor bilaterally. Gait and Station: Rising up from seated position without assistance, normal stance,  moderate stride, good arm swing, smooth turning, able to perform tiptoe, and heel walking without difficulty. Tandem gait is steady     DIAGNOSTIC DATA (LABS, IMAGING, TESTING) - I reviewed patient records, labs, notes, testing and imaging myself where available.    ASSESSMENT AND PLAN  41 y.o. year old female  has a past medical history of Anemia; Anxiety;  Depression; Migraine; and OCD (obsessive compulsive disorder). here To follow-up. She has failed multiple preventives in the past to includeTopamax, mental fogginess, nortriptyline, no help, verapamil, beta blocker, she could not tolerate lamotrigine,Depakote did not help. She has also tried triptan's Imitrex, Maxalt, Relpax, high co-pay for Frova, without benefit.     PLAN:Continue Zonegran 100 mg twice daily  Continue tizanidine 4 mg every 6 hours as needed will refill abortive treatment Continue Toradol 10 mg as needed will refill abortive treatment Patient also has Percocet 30 tablets to take every 2 months Once again discussed Botox but she has not met an insurance deductible Return to clinic in 4 months next with Dr. Krista Blue to discuss Botox I spent 15 min in total face to face time with the patient more than 50% of which was spent counseling and coordination of care, reviewing test results reviewing medications and discussing and reviewing the diagnosis of migraine and Botox as tx option with so many failed preventatives.  Dennie Bible, [facility], [facility], APRN  [facility] [address], [place], [place] [address] [phone]

## 2016-07-22 NOTE — Progress Notes (Signed)
I have reviewed and agreed above plan. 

## 2016-08-17 ENCOUNTER — Telehealth: Payer: Self-pay | Admitting: Neurology

## 2016-08-17 MED ORDER — OXYCODONE-ACETAMINOPHEN 7.5-325 MG PO TABS: 1.0000 | ORAL_TABLET | Freq: Three times a day (TID) | ORAL | 0 refills | Status: DC | PRN

## 2016-08-17 NOTE — Telephone Encounter (Signed)
[place] narcotic registry reviewed with no problems noted.  Rx printed, signed and placed up front for pick up.

## 2016-08-17 NOTE — Telephone Encounter (Signed)
Pt calling for refill of oxyCODONE-acetaminophen (PERCOCET) 7.5-325 MG tablet

## 2016-08-17 NOTE — Addendum Note (Signed)
Addended by: Lindell SparKIRKMAN, MICHELLE C on: 08/17/2016 10:26 AM   Modules accepted: Orders

## 2016-10-14 ENCOUNTER — Telehealth: Payer: Self-pay | Admitting: Neurology

## 2016-10-14 MED ORDER — OXYCODONE-ACETAMINOPHEN 7.5-325 MG PO TABS: 1.0000 | ORAL_TABLET | Freq: Three times a day (TID) | ORAL | 0 refills | Status: DC | PRN

## 2016-10-14 NOTE — Addendum Note (Signed)
Addended by: Lindell SparKIRKMAN, MICHELLE C on: 10/14/2016 09:56 AM   Modules accepted: Orders

## 2016-10-14 NOTE — Telephone Encounter (Signed)
Patient called office requesting refill for oxyCODONE-acetaminophen (PERCOCET) 7.5-325 MG tablet. °

## 2016-10-14 NOTE — Telephone Encounter (Signed)
[place] narcotic registry reviewed with no problems noted.  Rx post-dated, printed, signed and placed up front for pick up.

## 2016-11-16 ENCOUNTER — Ambulatory Visit: Payer: 59 | Admitting: Neurology

## 2016-12-15 ENCOUNTER — Telehealth: Payer: Self-pay | Admitting: Neurology

## 2016-12-15 MED ORDER — OXYCODONE-ACETAMINOPHEN 7.5-325 MG PO TABS: 1.0000 | ORAL_TABLET | Freq: Three times a day (TID) | ORAL | 0 refills | Status: DC | PRN

## 2016-12-15 NOTE — Telephone Encounter (Signed)
No issues noted in [place] narcotic registry.  Rx printed, signed and placed up front for pick up.

## 2016-12-15 NOTE — Telephone Encounter (Signed)
Pt request refill for oxyCODONE-acetaminophen (PERCOCET) 7.5-325 MG tablet °

## 2016-12-15 NOTE — Addendum Note (Signed)
Addended by: Lindell SparKIRKMAN, MICHELLE C on: 12/15/2016 10:30 AM   Modules accepted: Orders

## 2017-01-18 ENCOUNTER — Encounter (INDEPENDENT_AMBULATORY_CARE_PROVIDER_SITE_OTHER): Payer: Self-pay

## 2017-01-18 ENCOUNTER — Ambulatory Visit (INDEPENDENT_AMBULATORY_CARE_PROVIDER_SITE_OTHER): Payer: 59 | Admitting: Neurology

## 2017-01-18 ENCOUNTER — Encounter: Payer: Self-pay | Admitting: Neurology

## 2017-01-18 VITALS — BP 122/81 | HR 99 | Ht 66.0 in | Wt 141.5 lb

## 2017-01-18 DIAGNOSIS — F341 Dysthymic disorder: Secondary | ICD-10-CM | POA: Diagnosis not present

## 2017-01-18 DIAGNOSIS — G43909 Migraine, unspecified, not intractable, without status migrainosus: Secondary | ICD-10-CM | POA: Diagnosis not present

## 2017-01-18 MED ORDER — FREMANEZUMAB-VFRM 225 MG/1.5ML ~~LOC~~ SOSY: 225.0000 mg | PREFILLED_SYRINGE | SUBCUTANEOUS | 4 refills | Status: DC

## 2017-01-18 NOTE — Progress Notes (Signed)
**  Ajovy /1.66ml  subcutaneous injection sample provided to patient, her husband is a Engineer, civil (consulting) and will administer the medication at home.  NDC 82956-213-08, Lot [id number], Exp 05/2018.//mck,rn**

## 2017-01-18 NOTE — Progress Notes (Signed)
[facility]  PATIENT: Sheila Woods DOB: [birth year]   REASON FOR VISIT: Follow-up for migraine  HISTORY FROM: Patient    HISTORY OF PRESENT ILLNESS: [name], 41 year-old female returns for followup of her chronic migraine.  She has PMHx of OCD, iron deficiency anemia, depression presenting with chronic migraines.She had headaches since age 57, migraine described as severe stabbing pain, lateralized, radiating to her jaw, often proceed by visual changes, loss of peripheral vision.  sometimes, oculomigraine alone without significant headahces. Headache lasted 2 hours, with preventive Topamax, she only has 1-2 every month,  She is taking percocet 7.5/325 prn and sumavel prn. Tired imitrex injection, and other triptan in the past including imitrex po, maxalt, relpax, zomig, triximex,without help.  She has tried other preventive medications in the past including pamelor, effexor, elavil, nortriptyline, depakote without help either.  07/16/11: Pt reports migraines are better, she has been taking an over the counter preparation caled Limu. She claims she is taking her Topamax as well even though Dr. Krista Blue received a letter from [employer] stating her compliance with meds was less than 70%.She did not do Botox because of her co pay. She has seen ENT Dr. Janace Hoard who wanted her to have CT to see if polyps had returned but she did not want to go through another surgery. She has been placed on abilify by psych. She recently got married. No new neurologic complaints.  UPDATE March 22nd 2016:YY She still has headache 3 times a week, retrorbital area, occipital region, pounding, light noise sensitive, nauseous,  She has to take off from work, up to 2 days a week, she is on Topamax 150 mg daily, denies significant side effect, she used about 30 tablets of Percocet in one month, previously tried different triptan's, including Imitrex, Maxalt, Zomig, Relpax, without helping,  currently taking Imitrex injection as needed, not sure if this truly helping her, Percocet helps her temporarily, she is continue combating with significant depression, is taking Latuda and Clonazepam  UPDATE October 04 2014:YYShe still has frequent headaches, up to 4 severe migraine each week, she used to take Percocet as needed,  Higher dose of Topamax caused slurred speech, she is currently taking 100 mg daily, also tried Pamelor since last visit in March which did not help her, Cathren Harsh helps her only partially. She works for [employer], require computer work, and talking on the phone, since April 2016, she noticed slurred speech, Laboratory evaluation in June 2016 showed normal CMP, CBC, with exception of mild elevated glucose 130, WBC of 13 point 9 She also suffered severe depression, is under the care of Dr. Charolette Child, she is taking Latuda 80 mg daily, clonazepam 1 mg twice a day I reviewed her emergency room notes in June first 2016 for left kidney stone, CT abdomen in June 2016 showed: Partially obstructing tiny distal left ureteral calculus with associated hydronephrosis and delayed contrast excretion. No other urinary tract calculi identified.  Small uterine fibroids. UPDATE August 9th 2016:YY We have reviewed MRI of the brain that was normal, she slides down steps today, landed on her buttock, with bruising of her arms  She complains of worsening headaches over the past 2 days, constant, moderate, light noise sensitivity, nauseous, she has to take today off from work, she has tried multiple dose of Cambia and Percocet without help  CAT scan in June showed evidence of kidney stone, Topamax is no longer a good choice, she also complains of mental cloudiness was  Topamax, require a depressed, I have discussed treatment options, decided to switch her to lamotrigine, which can help her depression  UPDATE Feb 19 2015:YY She has tried and failed different triptan seen  in the past, currently taking cambia as needed, sometimes require half to one tablets of Percocet with cambia She has been followed up with her psychiatrist Salome Holmes, has a follow-up visit next week, currently taking Latuda 80 mg daily, clonazepam every night for depression anxiety  We have tried different triptan treatment in the past including Imitrex, Maxalt, Relpax, high co-pay for Frova,  UPDATE June 26th 2017:YY She continue complaining 3-4 headaches each week, severe, pounding with light noise sensitivity, lasting all day, she is now taking Percocet 7.5/325 mg half tablets along with diclofenac 50 mg tablets as needed, which helps her headaches, She continued to fighting her depression anxiety  UPDATE 04/11/18CM Ms.Corredor, 41 year old female returns for follow-up. She has a history of migraine headaches and is now  on Zonegran as preventive. . She claims her headaches are doing okay. She is having 3 to 4 a week . She would like to get Botox but needs to meet her insurance deductible first she has tried multiple preventives in the past to includeTopamax, mental fogginess, nortriptyline, no help, verapamil, beta blocker, she could not tolerate lamotrigine,Depakote did not help she has also tried triptan's Imitrex, Maxalt, Relpax, high co-pay for Frova, without benefit. She is also on tizanidine 4 mg every 6 hours when necessary which is helpful and she has Zofran for nausea. Acutely she uses Toradol or Percocet. .She returns for reevaluation  Update January 18 2017: She has migraine headache 2-3 each week, her migraine last all day,   She is now taking Toradol  prn, tizanidine, as needed for headaches, it last about few hours,  She has tried different triptan treatments in the past, such as Imitrex, Maxalt, Relpax, Frova without helping,  For preventive medication she has tried and failed Topamax, nortriptyline, verapamil, beta blocker, could not tolerate lamotrigine, Depakote did  not help,  REVIEW OF SYSTEMS: Full 14 system review of systems performed and notable only for those listed, all others are neg:  Headache, speech difficulty, depression, anxiety, insomnia, snoring.  ALLERGIES: No Known Allergies  HOME MEDICATIONS: Outpatient Medications Prior to Visit  Medication Sig Dispense Refill  . BELSOMRA 20 MG TABS 20 mg at bedtime.    . cetirizine (ZYRTEC) 10 MG chewable tablet Chew 10 mg by mouth daily.    . clonazePAM (KLONOPIN) 2 MG tablet Take 2 mg by mouth 2 (two) times daily.    . Cyanocobalamin (VITAMIN B-12 CR PO) Take 1 tablet by mouth at bedtime.     Marland Kitchen ketorolac (TORADOL) 10 MG tablet Take 1 tablet (10 mg total) by mouth every 6 (six) hours as needed. 20 tablet 1  . LATUDA 80 MG TABS tablet Take 80 mg by mouth daily with breakfast.     . levothyroxine (SYNTHROID, LEVOTHROID) 100 MCG tablet Take 100 mcg by mouth daily before breakfast.    . ondansetron (ZOFRAN) 4 MG tablet Take 1 tablet (4 mg total) by mouth every 6 (six) hours. (Patient taking differently: Take 4 mg by mouth every 6 (six) hours as needed. ) 12 tablet 0  . oxyCODONE-acetaminophen (PERCOCET) 7.5-325 MG tablet Take 1 tablet by mouth every 8 (eight) hours as needed for moderate pain. Two month supply. 30 tablet 0  . tiZANidine (ZANAFLEX) 4 MG tablet TAKE 1 TABLET BY MOUTH EVERY 6 HOURS AS  NEEDED FOR MUSCLE SPASMS. 60 tablet 3  . zonisamide (ZONEGRAN) 100 MG capsule Take 1 capsule (100 mg total) by mouth 2 (two) times daily. 60 capsule 11  . DICLOFENAC POTASSIUM PO Take by mouth as needed.     No facility-administered medications prior to visit.     PAST MEDICAL HISTORY: Past Medical History:  Diagnosis Date  . Anemia   . Anxiety   . Asthma   . Depression   . Eczema   . Hashimoto's disease   . Migraine   . OCD (obsessive compulsive disorder)     PAST SURGICAL HISTORY: Past Surgical History:  Procedure Laterality Date  . CESAREAN SECTION    . RHINOPLASTY    . SEPTOPLASTY       FAMILY HISTORY: No family history on file.  SOCIAL HISTORY: Social History   Social History  . Marital status: Married    Spouse name: [name]  . Number of children: 1  . Years of education: college   Occupational History  .  [employer]   Social History Main Topics  . Smoking status: Former Smoker    Packs/day: 0.25    Types: Cigarettes    Quit date: 07/16/2014  . Smokeless tobacco: Never Used  . Alcohol use Yes     Comment: occas.  . Drug use: No     Comment: quit over 20 years ago  . Sexual activity: Yes    Birth control/ protection: Condom   Other Topics Concern  . Not on file   Social History Narrative   Patient is married [name]) and has one child.   Patient works from home for [employer].   Patient has a college education.   Patient drinks one cup of caffeine daily.           PHYSICAL EXAM  Vitals:   01/18/17 1528  BP: 122/81  Pulse: 99  Weight: 141 lb 8 oz (64.2 kg)  Height:  (1.676 m)   Body mass index is 22.84 kg/m.    PHYSICAL EXAMNIATION:  Gen: NAD, conversant, well nourised, obese, well groomed                     Cardiovascular: Regular rate rhythm, no peripheral edema, warm, nontender. Eyes: Conjunctivae clear without exudates or hemorrhage Neck: Supple, no carotid bruits. Pulmonary: Clear to auscultation bilaterally   NEUROLOGICAL EXAM:  MENTAL STATUS: Speech:    Speech is normal; fluent and spontaneous with normal comprehension.  Cognition:     Orientation to time, place and person     Normal recent and remote memory     Normal Attention span and concentration     Normal Language, naming, repeating,spontaneous speech     Fund of knowledge   CRANIAL NERVES: CN II: Visual fields are full to confrontation. Fundoscopic exam is normal with sharp discs and no vascular changes. Pupils are round equal and briskly reactive to light. CN III, IV, VI: extraocular movement are normal. No ptosis. CN V: Facial  sensation is intact to pinprick in all 3 divisions bilaterally. Corneal responses are intact.  CN VII: Face is symmetric with normal eye closure and smile. CN VIII: Hearing is normal to rubbing fingers CN IX, X: Palate elevates symmetrically. Phonation is normal. CN XI: Head turning and shoulder shrug are intact CN XII: Tongue is midline with normal movements and no atrophy.  MOTOR: There is no pronator drift of out-stretched arms. Muscle bulk and tone are normal. Muscle  strength is normal.  REFLEXES: Reflexes are 2+ and symmetric at the biceps, triceps, knees, and ankles. Plantar responses are flexor.  SENSORY: Intact to light touch, pinprick, positional and vibratory sensation are intact in fingers and toes.  COORDINATION: Rapid alternating movements and fine finger movements are intact. There is no dysmetria on finger-to-nose and heel-knee-shin.    GAIT/STANCE: Posture is normal. Gait is steady with normal steps, base, arm swing, and turning. Heel and toe walking are normal. Tandem gait is normal.  Romberg is absent.  DIAGNOSTIC DATA (LABS, IMAGING, TESTING) - I reviewed patient records, labs, notes, testing and imaging myself where available.  ASSESSMENT AND PLAN  41 y.o. year old female   Chronic migraine headache Depression anxiety  Continue preventive medications Zonegran 100 twice a day,   Toradol 10 mg, tizanidine 4 mg as needed for abortive treatment   I gave her a free sample of Ajovy SQ today, also write her 3 months prescription with 4 reill, 0 dollar copay card.  Levert Feinstein, M.D. Ph.D.  [facility] [address] [place], [place] [address] Phone: [phone] Fax:      [phone]

## 2017-01-19 ENCOUNTER — Telehealth: Payer: Self-pay

## 2017-01-19 NOTE — Telephone Encounter (Signed)
PA approval received from [facility] - valid through 01/19/2018.  Pharmacy has been notified of approval.

## 2017-01-19 NOTE — Telephone Encounter (Signed)
I received documentation from CoverMyMeds that the PA for Ajovy has been approved from 01/19/17-01/19/18. PA# [facility] [id number] [facility]

## 2017-02-12 ENCOUNTER — Telehealth: Payer: Self-pay | Admitting: Neurology

## 2017-02-12 NOTE — Telephone Encounter (Signed)
Check her percocet, make sure it is time for a new Rx.

## 2017-02-12 NOTE — Telephone Encounter (Signed)
Pt request refill for oxyCODONE-acetaminophen (PERCOCET) 7.5-325 MG tablet . Pt is aware the RX should be ready on Monday

## 2017-02-14 NOTE — Telephone Encounter (Signed)
Patient is provided #30 oxycodone every 2 months for migraines.  She is current on appts. No issues noted.  Her last prescription was provided 12/15/16.

## 2017-02-15 MED ORDER — OXYCODONE-ACETAMINOPHEN 7.5-325 MG PO TABS: 1.0000 | ORAL_TABLET | Freq: Three times a day (TID) | ORAL | 0 refills | Status: DC | PRN

## 2017-02-15 NOTE — Telephone Encounter (Signed)
Printed, signed and placed up front

## 2017-02-15 NOTE — Addendum Note (Signed)
Addended by: Lindell SparKIRKMAN, MICHELLE C on: 02/15/2017 07:56 AM   Modules accepted: Orders

## 2017-04-19 ENCOUNTER — Telehealth: Payer: Self-pay | Admitting: Neurology

## 2017-04-19 MED ORDER — OXYCODONE-ACETAMINOPHEN 7.5-325 MG PO TABS: 1.0000 | ORAL_TABLET | Freq: Three times a day (TID) | ORAL | 0 refills | Status: DC | PRN

## 2017-04-19 NOTE — Telephone Encounter (Signed)
[place] and is needing a refill oxyCODONE-acetaminophen (PERCOCET) 7.5-325 MG tablet if possible mailed to her new address

## 2017-04-19 NOTE — Telephone Encounter (Addendum)
Patient [place] moved to [place] and will be establishing new care there.  She is due for her oxycodone prescription and is requesting one additional refill to allow her time to find new physician.  There are no issues noted on the narcotic registry and she is due for a refill at this time.  She is aware that we can mail one prescription to her but she will need to find new care before it becomes due again.  She is also aware that if for any reason this prescription becomes lost in the mail that we are unable to replace it.  She verbalized understanding.  I confirmed her new address with her twice today.  Rx printed, signed and placed in mail today.

## 2017-05-11 ENCOUNTER — Ambulatory Visit: Payer: 59 | Admitting: Nurse Practitioner

## 2017-06-28 ENCOUNTER — Telehealth: Payer: Self-pay | Admitting: Neurology

## 2017-06-28 MED ORDER — OXYCODONE-ACETAMINOPHEN 7.5-325 MG PO TABS: 1.0000 | ORAL_TABLET | Freq: Three times a day (TID) | ORAL | 0 refills | Status: DC | PRN

## 2017-06-28 NOTE — Telephone Encounter (Signed)
[place] Narcotic Registry reviewed without issues.  No capability to check the narcotic registry in [place].  However, this patient has never had a problem in the past.  Additionally, she is only provided oxycodone-apap 7.5-325mg , #30 for a two month supply.  She will only need today's refill and one other prior to becoming established with [place] new neurologist [place].

## 2017-06-28 NOTE — Telephone Encounter (Signed)
Percocet 7.5/325mg  30 tabs for 2 months

## 2017-06-28 NOTE — Telephone Encounter (Signed)
Patient requesting Rx for  oxyCODONE-acetaminophen (PERCOCET) 7.5-325 MG tablet be mailed to her. [place] and has an appointment with a neurologist there on 10-19-17.

## 2017-06-28 NOTE — Telephone Encounter (Signed)
Left message for patient to return my call.

## 2017-06-28 NOTE — Addendum Note (Signed)
Addended by: Levert FeinsteinYAN, Genna Casimir on: 06/28/2017 04:11 PM   Modules accepted: Orders

## 2017-07-24 ENCOUNTER — Other Ambulatory Visit: Payer: Self-pay | Admitting: Neurology

## 2017-07-27 ENCOUNTER — Other Ambulatory Visit: Payer: Self-pay | Admitting: Neurology

## 2017-07-29 ENCOUNTER — Other Ambulatory Visit: Payer: Self-pay | Admitting: Neurology

## 2017-08-24 ENCOUNTER — Other Ambulatory Visit: Payer: Self-pay | Admitting: Neurology

## 2017-08-24 MED ORDER — OXYCODONE-ACETAMINOPHEN 7.5-325 MG PO TABS: 1.0000 | ORAL_TABLET | Freq: Three times a day (TID) | ORAL | 0 refills | Status: AC | PRN

## 2017-08-24 NOTE — Telephone Encounter (Signed)
Pt requesting a refill for oxyCODONE-acetaminophen (PERCOCET) 7.5-325 MG tablet sent to [facility]. Stating her appt with a new neurologist isn't until 7/20

## 2017-08-24 NOTE — Telephone Encounter (Signed)
[place] Narcotic Registry reviewed without issues.  No capability to check the narcotic registry in [place].  However, this patient has never had a problem in the past.  Additionally, she is only provided oxycodone-apap 7.5-325mg , #30 for a two month supply.  This is the last refill she will need prior to becoming established with her new neurologist in [place], [place] on 10/23/17.

## 2017-08-31 NOTE — Telephone Encounter (Signed)
error 

## 2017-09-08 ENCOUNTER — Telehealth: Payer: Self-pay | Admitting: Neurology

## 2017-09-08 MED ORDER — FREMANEZUMAB-VFRM 225 MG/1.5ML ~~LOC~~ SOSY: 225.0000 mg | PREFILLED_SYRINGE | SUBCUTANEOUS | 0 refills | Status: AC

## 2017-09-08 NOTE — Telephone Encounter (Addendum)
Patient has an appointment with her new neurologist on 10/19/17.  Refill sent to requested pharmacy so she does not run out of medication.

## 2017-09-08 NOTE — Addendum Note (Signed)
Addended by: Lilla ShookKIRKMAN, MICHELLE C on: 09/08/2017 09:39 AM   Modules accepted: Orders

## 2017-09-08 NOTE — Telephone Encounter (Signed)
Patient requesting refill of Fremanezumab-vfrm (AJOVY) 225 MG/1.5ML SOSY [facility] [place]tel # [phone]. [place] has moved to [place] and has appointment with a neurologist in July.
